# Patient Record
Sex: Female | Born: 1995 | Race: Black or African American | Hispanic: No | Marital: Single | Smoking: Former smoker
Health system: Southern US, Community
[De-identification: ages and names within clinical notes are randomized; demographics above are authoritative.]

## PROBLEM LIST (undated history)

## (undated) ENCOUNTER — Inpatient Hospital Stay (HOSPITAL_COMMUNITY): Payer: Self-pay

## (undated) DIAGNOSIS — J020 Streptococcal pharyngitis: Secondary | ICD-10-CM

## (undated) DIAGNOSIS — A150 Tuberculosis of lung: Secondary | ICD-10-CM

## (undated) HISTORY — PX: KNEE SURGERY: SHX244

---
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
## 2013-03-16 ENCOUNTER — Ambulatory Visit
Admission: RE | Admit: 2013-03-16 | Discharge: 2013-03-16 | Disposition: A | Discharge status: Home / Self Care | Payer: No Typology Code available for payment source | Source: Ambulatory Visit | Attending: Infectious Disease | Admitting: Infectious Disease

## 2013-03-16 ENCOUNTER — Other Ambulatory Visit: Payer: Self-pay | Admitting: Infectious Disease

## 2013-03-16 DIAGNOSIS — R7611 Nonspecific reaction to tuberculin skin test without active tuberculosis: Secondary | ICD-10-CM

## 2013-05-13 ENCOUNTER — Other Ambulatory Visit: Payer: Self-pay

## 2013-07-05 ENCOUNTER — Encounter (HOSPITAL_COMMUNITY): Payer: Self-pay | Admitting: Emergency Medicine

## 2013-07-05 ENCOUNTER — Emergency Department (HOSPITAL_COMMUNITY)
Admission: EM | Admit: 2013-07-05 | Discharge: 2013-07-05 | Disposition: A | Discharge status: Home / Self Care | Payer: Self-pay | Attending: Emergency Medicine | Admitting: Emergency Medicine

## 2013-07-05 ENCOUNTER — Emergency Department (HOSPITAL_COMMUNITY): Payer: Self-pay

## 2013-07-05 DIAGNOSIS — Z3202 Encounter for pregnancy test, result negative: Secondary | ICD-10-CM | POA: Insufficient documentation

## 2013-07-05 DIAGNOSIS — R109 Unspecified abdominal pain: Secondary | ICD-10-CM

## 2013-07-05 DIAGNOSIS — R0602 Shortness of breath: Secondary | ICD-10-CM | POA: Insufficient documentation

## 2013-07-05 DIAGNOSIS — Z79899 Other long term (current) drug therapy: Secondary | ICD-10-CM | POA: Insufficient documentation

## 2013-07-05 DIAGNOSIS — Z87891 Personal history of nicotine dependence: Secondary | ICD-10-CM | POA: Insufficient documentation

## 2013-07-05 DIAGNOSIS — R1012 Left upper quadrant pain: Secondary | ICD-10-CM | POA: Insufficient documentation

## 2013-07-05 DIAGNOSIS — Z8619 Personal history of other infectious and parasitic diseases: Secondary | ICD-10-CM | POA: Insufficient documentation

## 2013-07-05 HISTORY — DX: Tuberculosis of lung: A15.0

## 2013-07-05 LAB — CBC WITH DIFFERENTIAL/PLATELET
Basophils Relative: 0 % (ref 0–1)
Eosinophils Absolute: 0.2 10*3/uL (ref 0.0–0.7)
Eosinophils Relative: 3 % (ref 0–5)
HCT: 35.9 % — ABNORMAL LOW (ref 36.0–46.0)
Hemoglobin: 11.4 g/dL — ABNORMAL LOW (ref 12.0–15.0)
Lymphs Abs: 1.8 10*3/uL (ref 0.7–4.0)
MCH: 24.6 pg — ABNORMAL LOW (ref 26.0–34.0)
MCHC: 31.8 g/dL (ref 30.0–36.0)
MCV: 77.5 fL — ABNORMAL LOW (ref 78.0–100.0)
Monocytes Absolute: 0.5 10*3/uL (ref 0.1–1.0)
Monocytes Relative: 10 % (ref 3–12)
Neutro Abs: 2.2 10*3/uL (ref 1.7–7.7)
Neutrophils Relative %: 48 % (ref 43–77)
Platelets: 212 10*3/uL (ref 150–400)
RBC: 4.63 MIL/uL (ref 3.87–5.11)
RDW: 14.4 % (ref 11.5–15.5)

## 2013-07-05 LAB — URINALYSIS, ROUTINE W REFLEX MICROSCOPIC
Bilirubin Urine: NEGATIVE
Glucose, UA: NEGATIVE mg/dL
Specific Gravity, Urine: 1.031 — ABNORMAL HIGH (ref 1.005–1.030)
pH: 5.5 (ref 5.0–8.0)

## 2013-07-05 LAB — COMPREHENSIVE METABOLIC PANEL
ALT: 9 U/L (ref 0–35)
Albumin: 3.8 g/dL (ref 3.5–5.2)
Alkaline Phosphatase: 59 U/L (ref 39–117)
BUN: 13 mg/dL (ref 6–23)
Calcium: 8.9 mg/dL (ref 8.4–10.5)
Chloride: 101 mEq/L (ref 96–112)
GFR calc non Af Amer: >90 mL/min (ref >90)
Potassium: 3.6 mEq/L (ref 3.5–5.1)
Sodium: 137 mEq/L (ref 135–145)
Total Bilirubin: 0.3 mg/dL (ref 0.3–1.2)
Total Protein: 7.7 g/dL (ref 6.0–8.3)

## 2013-07-05 LAB — URINE MICROSCOPIC-ADD ON

## 2013-07-05 LAB — LIPASE, BLOOD: Lipase: 50 U/L (ref 11–59)

## 2013-07-05 MED ORDER — HYDROMORPHONE HCL PF 1 MG/ML IJ SOLN: 0.5000 mg | Freq: Once | INTRAMUSCULAR | Status: DC

## 2013-07-05 NOTE — ED Provider Notes (Signed)
CSN: [id number]     Arrival date & time 07/05/13  1445 History   First MD Initiated Contact with Patient 07/05/13 1711     Chief Complaint  Patient presents with  . Abdominal Pain   (Consider location/radiation/quality/duration/timing/severity/associated sxs/prior Treatment) HPI Comments: Sarah Norris is a 17 y.o. year-old female with a past medical history of Tb, presenting the Emergency Department with a chief complaint of LUQ pain.  She reports ongoing pain since January 2014, worsened in the past month. 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  She reports her sister traveled to [place] 2 years ago for her wedding and returned 1 month later. She denies diarrhea or constipation, denies bloody stools. She denies hematuria or dysuria. Denies aggrevating factors.  Reports OTC pain medication without relief. Patient's last menstrual period was 06/05/2013. No recent travel, family history or personal history of DVT/PE, lower extremity swelling, smoking, cancer, or exogenous estrogen. Denies travel outside of the country for two years.    The history is provided by the patient and medical records. No language interpreter was used.    Past Medical History  Diagnosis Date  . TB (pulmonary tuberculosis)    Past Surgical History  Procedure Laterality Date  . Knee surgery     Family History  Problem Relation Age of Onset  . Hypertension Mother   . Diabetes Mother    History  Substance Use Topics  . Smoking status: Former Games developer  . Smokeless tobacco: Not on file  . Alcohol Use: Yes     Comment: occ   OB History   Grav Para Term Preterm Abortions TAB SAB Ect Mult Living                 Review of Systems  Constitutional: Negative for fever and chills.  Respiratory: Positive for shortness of breath. Negative for stridor.   Cardiovascular: Negative for palpitations and leg swelling.  Gastrointestinal:  Positive for abdominal pain. Negative for nausea, vomiting, diarrhea, constipation, blood in stool and anal bleeding.    Allergies  Review of patient's allergies indicates no known allergies.  Home Medications   Current Outpatient Rx  Name  Route  Sig  Dispense  Refill  . rifampin (RIFADIN) 300 MG capsule   Oral   Take 600 mg by mouth daily.          BP 110/60  Pulse 66  Temp(Src) 98 F (36.7 C) (Oral)  Resp 16  SpO2 100%  LMP 06/05/2013 Physical Exam  Nursing note and vitals reviewed. Constitutional: She is oriented to person, place, and time. She appears well-developed and well-nourished.  HENT:  Head: Normocephalic and atraumatic.  Cardiovascular: Normal rate.  An irregular rhythm present.  No murmur heard. Pulmonary/Chest: Effort normal and breath sounds normal. She has no wheezes. She has no rales.  Abdominal: Soft. There is tenderness in the left upper quadrant. There is no rigidity, no CVA tenderness, no tenderness at McBurney's point and negative Murphy's sign.  Mild tenderness to palpation  Neurological: She is alert and oriented to person, place, and time.    ED Course  Procedures (including critical care time) Labs Review Labs Reviewed  CBC WITH DIFFERENTIAL - Abnormal; Notable for the following:    Hemoglobin 11.4 (*)    HCT 35.9 (*)    MCV 77.5 (*)    MCH 24.6 (*)    All other components within normal limits  URINALYSIS, ROUTINE W REFLEX MICROSCOPIC -  Abnormal; Notable for the following:    APPearance CLOUDY (*)    Specific Gravity, Urine 1.031 (*)    Hgb urine dipstick SMALL (*)    Ketones, ur 15 (*)    Leukocytes, UA LARGE (*)    All other components within normal limits  URINE MICROSCOPIC-ADD ON - Abnormal; Notable for the following:    Squamous Epithelial / LPF MANY (*)    Bacteria, UA FEW (*)    All other components within normal limits  URINE CULTURE  COMPREHENSIVE METABOLIC PANEL  LIPASE, BLOOD  PREGNANCY, URINE   Imaging Review Dg  Chest 2 View  07/05/2013   CLINICAL DATA:  Abdominal pain, shortness of breath  EXAM: CHEST  2 VIEW  COMPARISON:  03/16/2013  FINDINGS: Grossly unchanged cardiac silhouette and mediastinal contours. No focal parenchymal opacities. No pleural effusion or pneumothorax. No definite evidence of edema. Unchanged bones.  IMPRESSION: No acute cardiopulmonary disease. Specifically, no evidence of pneumonia.   Electronically Signed   By: Simonne Come M.D.   On: 07/05/2013 19:14    EKG Interpretation   None       MDM   1. 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  Minimal discomfort on exam. Labs sent.  Hbg 11.4, stable, no electrolyte abnormalities, UA without infection, XR- without acute findings.  Discussed patient history, condition, and labs with Dr. Ethelda Chick. After his evaluation of the patient he agrees the patient can be evaluated as an out-pt. Discussed lab results, imaging results, and treatment plan with the patient. Return precautions given. Reports understanding and no other concerns at this time.  Patient is stable for discharge at this time.        Clabe Seal, PA-C 07/06/13 1455

## 2013-07-05 NOTE — ED Provider Notes (Signed)
Complains of left upper quadrant pain for one year. Pain is presently mild described as 1 on scale of 1-10. She denies shortness of breath no treatment prior to coming here. No anorexia. No shortness of breath. Patient had positive skin test for tuberculosis. Chest x-ray negative. On exam no distress lungs clear auscultation abdomen nondistended nontender normal active bowel sounds no splenomegaly  Doug Sou, MD 07/05/13 1918

## 2013-07-05 NOTE — ED Notes (Signed)
Pt states she has taken OTC meds for this with no relief; pt has not sought medical attention until now.  The timing of the pain varies from when she has eaten to when she hasn't eaten to when she is on her cycle to when she is not on her cycle.

## 2013-07-05 NOTE — ED Notes (Addendum)
Pt states she's had L side abdominal pain x 1 year.  Pt states pain has gotten worse over the last month.  Pt also states that sometimes at night she feels like she can't breath.  Pt alert and oriented and in NAD at this time.  Respiration equal and unlabored.

## 2013-07-06 ENCOUNTER — Emergency Department (HOSPITAL_COMMUNITY)
Admission: EM | Admit: 2013-07-06 | Discharge: 2013-07-06 | Discharge status: Against Medical Advice | Payer: Medicaid Other | Attending: Emergency Medicine | Admitting: Emergency Medicine

## 2013-07-06 ENCOUNTER — Encounter (HOSPITAL_COMMUNITY): Payer: Self-pay | Admitting: Emergency Medicine

## 2013-07-06 DIAGNOSIS — R079 Chest pain, unspecified: Secondary | ICD-10-CM | POA: Insufficient documentation

## 2013-07-06 DIAGNOSIS — Z79899 Other long term (current) drug therapy: Secondary | ICD-10-CM | POA: Insufficient documentation

## 2013-07-06 DIAGNOSIS — Z8611 Personal history of tuberculosis: Secondary | ICD-10-CM | POA: Insufficient documentation

## 2013-07-06 DIAGNOSIS — R0602 Shortness of breath: Secondary | ICD-10-CM | POA: Insufficient documentation

## 2013-07-06 DIAGNOSIS — Z87891 Personal history of nicotine dependence: Secondary | ICD-10-CM | POA: Insufficient documentation

## 2013-07-06 LAB — URINE CULTURE

## 2013-07-06 NOTE — ED Notes (Signed)
Pt. reports mid chest pain onset this morning with slight SOB , denies nausea or diaphoresis .

## 2013-07-07 NOTE — ED Provider Notes (Signed)
Medical screening examination/treatment/procedure(s) were conducted as a shared visit with non-physician practitioner(s) and myself.  I personally evaluated the patient during the encounter.  EKG Interpretation   None        Sailor Haughn, MD 07/07/13 1056 

## 2013-07-08 DIAGNOSIS — A150 Tuberculosis of lung: Secondary | ICD-10-CM

## 2013-07-08 HISTORY — DX: Tuberculosis of lung: A15.0

## 2014-01-17 ENCOUNTER — Emergency Department (HOSPITAL_COMMUNITY): Payer: Medicaid Other

## 2014-01-17 ENCOUNTER — Encounter (HOSPITAL_COMMUNITY): Payer: Self-pay | Admitting: Emergency Medicine

## 2014-01-17 ENCOUNTER — Emergency Department (HOSPITAL_COMMUNITY)
Admission: EM | Admit: 2014-01-17 | Discharge: 2014-01-17 | Disposition: A | Discharge status: Home / Self Care | Payer: Medicaid Other | Attending: Emergency Medicine | Admitting: Emergency Medicine

## 2014-01-17 DIAGNOSIS — Z79899 Other long term (current) drug therapy: Secondary | ICD-10-CM | POA: Insufficient documentation

## 2014-01-17 DIAGNOSIS — Z87891 Personal history of nicotine dependence: Secondary | ICD-10-CM | POA: Insufficient documentation

## 2014-01-17 DIAGNOSIS — J020 Streptococcal pharyngitis: Secondary | ICD-10-CM | POA: Insufficient documentation

## 2014-01-17 DIAGNOSIS — Z8611 Personal history of tuberculosis: Secondary | ICD-10-CM | POA: Insufficient documentation

## 2014-01-17 HISTORY — DX: Streptococcal pharyngitis: J02.0

## 2014-01-17 LAB — RAPID STREP SCREEN (MED CTR MEBANE ONLY): Streptococcus, Group A Screen (Direct): POSITIVE — AB

## 2014-01-17 MED ORDER — PENICILLIN G BENZATHINE 1200000 UNIT/2ML IM SUSP
1.2000 10*6.[IU] | Freq: Once | INTRAMUSCULAR | Status: AC
  Administered 2014-01-17: 1.2 10*6.[IU] via INTRAMUSCULAR
  Filled 2014-01-17: qty 2

## 2014-01-17 MED ORDER — ACETAMINOPHEN 325 MG PO TABS
650.0000 mg | ORAL_TABLET | Freq: Once | ORAL | Status: AC
  Administered 2014-01-17: 650 mg via ORAL
  Filled 2014-01-17: qty 2

## 2014-01-17 NOTE — Discharge Instructions (Signed)
You have been treated for strep throat here. Refer to attached documents for more information. Follow up with your doctor as needed.

## 2014-01-17 NOTE — ED Notes (Signed)
PT reports throat pain and has a HXof strep throat. Pt also has RT ear pain.

## 2014-01-17 NOTE — ED Notes (Signed)
PT reports she completed treatment of TB 4 months ago. Today Pt has a fever of 100.5 with a sore throat.

## 2014-01-17 NOTE — ED Provider Notes (Signed)
CSN: [id number]     Arrival date & time 01/17/14  4098 History  This chart was scribed for non-physician practitioner Emilia Beck, PA-C working with Rolland Porter, MD by Leone Payor, ED Scribe. This patient was seen in room TR08C/TR08C and the patient's care was started at 10:48 AM.    Chief Complaint  Patient presents with  . Sore Throat  . Otalgia    The history is provided by the patient. No language interpreter was used.    HPI Comments: Sarah Norris is a 18 y.o. female who presents to the Emergency Department complaining of 2 days of gradual onset, gradually worsening, constant sore throat with associated fever of 100.5 in the ED. Patient states the pain is worse with swallowing. She has not taken any OTC medications for her symptoms. She is unsure of any sick contacts with similar symptoms. She denies cough.   Past Medical History  Diagnosis Date  . TB (pulmonary tuberculosis)     4 months ago 2015  . Strep throat    Past Surgical History  Procedure Laterality Date  . Knee surgery     Family History  Problem Relation Age of Onset  . Hypertension Mother   . Diabetes Mother    History  Substance Use Topics  . Smoking status: Former Games developer  . Smokeless tobacco: Not on file  . Alcohol Use: Yes     Comment: occ   OB History   Grav Para Term Preterm Abortions TAB SAB Ect Mult Living                 Review of Systems  Constitutional: Positive for fever.  HENT: Positive for sore throat. Negative for trouble swallowing.   Respiratory: Negative for cough.       Allergies  Review of patient's allergies indicates no known allergies.  Home Medications   Prior to Admission medications   Medication Sig Start Date End Date Taking? Authorizing Provider  rifampin (RIFADIN) 300 MG capsule Take 600 mg by mouth daily.    Historical Provider, MD   BP 126/66  Pulse 83  Temp(Src) 100.5 F (38.1 C)  Resp 20  Ht 5\' 3"  (1.6 m)  Wt 161 lb (73.029 kg)  BMI 28.53 kg/m2   SpO2 98%  LMP 01/08/2014 Physical Exam  Nursing note and vitals reviewed. Constitutional: She is oriented to person, place, and time. She appears well-developed and well-nourished.  HENT:  Head: Normocephalic and atraumatic.  Mouth/Throat: Posterior oropharyngeal erythema present. No oropharyngeal exudate or posterior oropharyngeal edema.  Posterior pharynx erythema with mild tonsillar edema. No exudate.   Neck: Normal range of motion. Neck supple.  Cardiovascular: Normal rate, regular rhythm and normal heart sounds.   Pulmonary/Chest: Effort normal and breath sounds normal. No respiratory distress. She has no wheezes. She has no rales.  Abdominal: She exhibits no distension.  Lymphadenopathy:    She has cervical adenopathy.  Neurological: She is alert and oriented to person, place, and time.  Skin: Skin is warm and dry.  Psychiatric: She has a normal mood and affect.    ED Course  Procedures (including critical care time)  DIAGNOSTIC STUDIES: Oxygen Saturation is 98% on RA, normal by my interpretation.    COORDINATION OF CARE: 10:51 AM Discussed treatment plan with pt at bedside and pt agreed to plan.   Labs Review Labs Reviewed  RAPID STREP SCREEN - Abnormal; Notable for the following:    Streptococcus, Group A Screen (Direct) POSITIVE (*)    All  other components within normal limits    Imaging Review Dg Chest 2 View  01/17/2014   CLINICAL DATA:  Fever.  Sore throat.  EXAM: CHEST  2 VIEW  COMPARISON:  PA and lateral chest 07/05/2013.  FINDINGS: Heart size and mediastinal contours are within normal limits. Both lungs are clear. Visualized skeletal structures are unremarkable.  IMPRESSION: Negative exam.   Electronically Signed   By: Drusilla Kannerhomas  Dalessio M.D.   On: 01/17/2014 10:40     EKG Interpretation None      MDM   Final diagnoses:  Strep pharyngitis   10:55 AM Patient will have IM bicillin for strep throat. Chest xray unremarkable. Patient's given tylenol for  fever. Patient's remaining vitals stable. Patient advised to follow up with PCP as needed. No further evaluation needed at this time.   I personally performed the services described in this documentation, which was scribed in my presence. The recorded information has been reviewed and is accurate.   Emilia BeckKaitlyn Mariane Burpee, PA-C 01/17/14 1056

## 2014-01-17 NOTE — ED Notes (Signed)
Declined W/C at D/C and was escorted to lobby by RN. 

## 2014-01-25 NOTE — ED Provider Notes (Signed)
Medical screening examination/treatment/procedure(s) were performed by non-physician practitioner and as supervising physician I was immediately available for consultation/collaboration.   EKG Interpretation None        Rolland PorterMark Tatanisha Cuthbert, MD 01/25/14 [phone]

## 2014-03-24 ENCOUNTER — Emergency Department (HOSPITAL_COMMUNITY): Payer: BC Managed Care – PPO

## 2014-03-24 ENCOUNTER — Emergency Department (HOSPITAL_COMMUNITY)
Admission: EM | Admit: 2014-03-24 | Discharge: 2014-03-25 | Disposition: A | Discharge status: Home / Self Care | Payer: BC Managed Care – PPO | Attending: Emergency Medicine | Admitting: Emergency Medicine

## 2014-03-24 ENCOUNTER — Encounter (HOSPITAL_COMMUNITY): Payer: Self-pay | Admitting: Emergency Medicine

## 2014-03-24 DIAGNOSIS — F3289 Other specified depressive episodes: Secondary | ICD-10-CM | POA: Diagnosis not present

## 2014-03-24 DIAGNOSIS — F329 Major depressive disorder, single episode, unspecified: Secondary | ICD-10-CM | POA: Insufficient documentation

## 2014-03-24 DIAGNOSIS — Z792 Long term (current) use of antibiotics: Secondary | ICD-10-CM | POA: Diagnosis not present

## 2014-03-24 DIAGNOSIS — Z8701 Personal history of pneumonia (recurrent): Secondary | ICD-10-CM | POA: Insufficient documentation

## 2014-03-24 DIAGNOSIS — R0602 Shortness of breath: Secondary | ICD-10-CM | POA: Insufficient documentation

## 2014-03-24 DIAGNOSIS — Z87891 Personal history of nicotine dependence: Secondary | ICD-10-CM | POA: Insufficient documentation

## 2014-03-24 DIAGNOSIS — Z8619 Personal history of other infectious and parasitic diseases: Secondary | ICD-10-CM | POA: Insufficient documentation

## 2014-03-24 DIAGNOSIS — F1210 Cannabis abuse, uncomplicated: Secondary | ICD-10-CM | POA: Diagnosis not present

## 2014-03-24 DIAGNOSIS — F32A Depression, unspecified: Secondary | ICD-10-CM

## 2014-03-24 DIAGNOSIS — Z3202 Encounter for pregnancy test, result negative: Secondary | ICD-10-CM | POA: Diagnosis not present

## 2014-03-24 DIAGNOSIS — F1290 Cannabis use, unspecified, uncomplicated: Secondary | ICD-10-CM

## 2014-03-24 LAB — CBC
HCT: 33.1 % — ABNORMAL LOW (ref 36.0–46.0)
Hemoglobin: 10.5 g/dL — ABNORMAL LOW (ref 12.0–15.0)
MCH: 24 pg — AB (ref 26.0–34.0)
MCHC: 31.7 g/dL (ref 30.0–36.0)
MCV: 75.6 fL — AB (ref 78.0–100.0)
PLATELETS: 242 10*3/uL (ref 150–400)
RBC: 4.38 MIL/uL (ref 3.87–5.11)
RDW: 14.1 % (ref 11.5–15.5)
WBC: 4.8 10*3/uL (ref 4.0–10.5)

## 2014-03-24 LAB — COMPREHENSIVE METABOLIC PANEL
ALT: 14 U/L (ref 0–35)
AST: 28 U/L (ref 0–37)
Albumin: 3.7 g/dL (ref 3.5–5.2)
Alkaline Phosphatase: 62 U/L (ref 39–117)
Anion gap: 12 (ref 5–15)
BUN: 17 mg/dL (ref 6–23)
CALCIUM: 9 mg/dL (ref 8.4–10.5)
CO2: 27 meq/L (ref 19–32)
Chloride: 101 mEq/L (ref 96–112)
Creatinine, Ser: 0.6 mg/dL (ref 0.50–1.10)
GFR calc Af Amer: >90 mL/min (ref >90)
GFR calc non Af Amer: >90 mL/min (ref >90)
Glucose, Bld: 94 mg/dL (ref 70–99)
Potassium: 4.1 mEq/L (ref 3.7–5.3)
Sodium: 140 mEq/L (ref 137–147)
Total Bilirubin: <0.2 mg/dL — ABNORMAL LOW (ref 0.3–1.2)
Total Protein: 7.7 g/dL (ref 6.0–8.3)

## 2014-03-24 LAB — RAPID URINE DRUG SCREEN, HOSP PERFORMED
Amphetamines: NOT DETECTED
BENZODIAZEPINES: NOT DETECTED
Barbiturates: NOT DETECTED
Cocaine: NOT DETECTED
Opiates: NOT DETECTED
Tetrahydrocannabinol: POSITIVE — AB

## 2014-03-24 LAB — URINALYSIS, ROUTINE W REFLEX MICROSCOPIC
BILIRUBIN URINE: NEGATIVE
Glucose, UA: NEGATIVE mg/dL
Hgb urine dipstick: NEGATIVE
Ketones, ur: NEGATIVE mg/dL
LEUKOCYTES UA: NEGATIVE
NITRITE: NEGATIVE
PH: 6 (ref 5.0–8.0)
Protein, ur: NEGATIVE mg/dL
Specific Gravity, Urine: 1.033 — ABNORMAL HIGH (ref 1.005–1.030)
Urobilinogen, UA: 0.2 mg/dL (ref 0.0–1.0)

## 2014-03-24 LAB — SALICYLATE LEVEL

## 2014-03-24 LAB — ETHANOL

## 2014-03-24 LAB — PREGNANCY, URINE: Preg Test, Ur: NEGATIVE

## 2014-03-24 LAB — ACETAMINOPHEN LEVEL: Acetaminophen (Tylenol), Serum: <15 ug/mL (ref 10–30)

## 2014-03-24 MED ORDER — IBUPROFEN 400 MG PO TABS: 600.0000 mg | ORAL_TABLET | Freq: Three times a day (TID) | ORAL | Status: DC | PRN

## 2014-03-24 MED ORDER — ACETAMINOPHEN 325 MG PO TABS: 650.0000 mg | ORAL_TABLET | ORAL | Status: DC | PRN

## 2014-03-24 NOTE — ED Notes (Signed)
Pt reports that she is seeing her school counselor and they suggested she come in because she has been depressed with a history of SI. Denies SI at present. Pt calm and cooperative, no visitors present.

## 2014-03-24 NOTE — ED Notes (Signed)
Pt reports she is here today because her school counselor thinks she needs to have a psych eval because she is depressed and having SI. Pt denies SI currently but sts she has had them in the past. Denies HI. sts that she has been depressed lately, not taking any medications for it right now. Nad, skin warm and dry, resp e/u.
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
## 2014-03-24 NOTE — ED Notes (Signed)
The patient has a nose ring, a belly button ring and earrings (a figure eight, gold). A cell phone a gold and white case. The tech has reported to RN in charge.

## 2014-03-25 MED ORDER — SERTRALINE HCL 50 MG PO TABS: 50.0000 mg | ORAL_TABLET | Freq: Every day | ORAL | Status: DC

## 2014-03-25 NOTE — ED Provider Notes (Signed)
Medical screening examination/treatment/procedure(s) were performed by non-physician practitioner and as supervising physician I was immediately available for consultation/collaboration.   EKG Interpretation None        Glynn Octave, MD 03/25/14 [phone]

## 2014-03-25 NOTE — ED Notes (Signed)
Telephsych machine at bedside, Marchelle Folks to assess at 1351

## 2014-03-25 NOTE — ED Notes (Signed)
This RN spoke with Marchelle Folks about d/c. EDP notified

## 2014-03-25 NOTE — ED Notes (Signed)
Marylu Lund, Pt mentor called to ask for update on pt status, Pt gave this RN verbal consent to speak with Marylu Lund. Pt mentor updated on TTS wait time. Pt also informed.
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
## 2014-03-25 NOTE — ED Provider Notes (Signed)
According to our behavioral health colleagues, the patient does not meet criteria for inpatient admission. Patient is amenable to discharge, with outpatient followup, which has been scheduled for her. Patient will be started on a new medication.    Gerhard Munch, MD 03/25/14 [phone]

## 2014-03-25 NOTE — ED Notes (Signed)
Spoke with Marchelle Folks, states pt is non SI/HI and would like to speak with EDP for possible anti depressant scripts and then pt is okay to be d/c home. EDP direct line given to [facility].

## 2014-03-25 NOTE — Discharge Instructions (Signed)
Please monitor your condition carefully, and do not hesitate to return for concerning changes in your condition.  Otherwise, please take all medication as directed, and be sure to followup with our behavioral health team.

## 2014-04-22 ENCOUNTER — Other Ambulatory Visit: Payer: Self-pay

## 2014-04-26 ENCOUNTER — Ambulatory Visit (HOSPITAL_COMMUNITY): Payer: Self-pay | Admitting: Psychiatry

## 2014-05-19 ENCOUNTER — Ambulatory Visit (HOSPITAL_COMMUNITY): Payer: BC Managed Care – PPO | Admitting: Psychiatry

## 2014-07-11 ENCOUNTER — Encounter (HOSPITAL_COMMUNITY): Payer: Self-pay | Admitting: Emergency Medicine

## 2014-07-11 ENCOUNTER — Emergency Department (HOSPITAL_COMMUNITY)
Admission: EM | Admit: 2014-07-11 | Discharge: 2014-07-11 | Disposition: A | Discharge status: Home / Self Care | Payer: No Typology Code available for payment source | Attending: Emergency Medicine | Admitting: Emergency Medicine

## 2014-07-11 DIAGNOSIS — Z87891 Personal history of nicotine dependence: Secondary | ICD-10-CM | POA: Insufficient documentation

## 2014-07-11 DIAGNOSIS — Z8611 Personal history of tuberculosis: Secondary | ICD-10-CM | POA: Insufficient documentation

## 2014-07-11 DIAGNOSIS — Z3202 Encounter for pregnancy test, result negative: Secondary | ICD-10-CM | POA: Insufficient documentation

## 2014-07-11 DIAGNOSIS — N390 Urinary tract infection, site not specified: Secondary | ICD-10-CM | POA: Insufficient documentation

## 2014-07-11 DIAGNOSIS — Z79899 Other long term (current) drug therapy: Secondary | ICD-10-CM | POA: Insufficient documentation

## 2014-07-11 DIAGNOSIS — Z8709 Personal history of other diseases of the respiratory system: Secondary | ICD-10-CM | POA: Insufficient documentation

## 2014-07-11 LAB — CBC WITH DIFFERENTIAL/PLATELET
Basophils Absolute: 0 10*3/uL (ref 0.0–0.1)
Basophils Relative: 0 % (ref 0–1)
EOS PCT: 1 % (ref 0–5)
Eosinophils Absolute: 0.1 10*3/uL (ref 0.0–0.7)
HCT: 35.9 % — ABNORMAL LOW (ref 36.0–46.0)
Hemoglobin: 11.5 g/dL — ABNORMAL LOW (ref 12.0–15.0)
LYMPHS PCT: 24 % (ref 12–46)
Lymphs Abs: 1.6 10*3/uL (ref 0.7–4.0)
MCH: 24.8 pg — ABNORMAL LOW (ref 26.0–34.0)
MCHC: 32 g/dL (ref 30.0–36.0)
MCV: 77.5 fL — AB (ref 78.0–100.0)
MONO ABS: 0.5 10*3/uL (ref 0.1–1.0)
Monocytes Relative: 7 % (ref 3–12)
Neutro Abs: 4.5 10*3/uL (ref 1.7–7.7)
Neutrophils Relative %: 68 % (ref 43–77)
PLATELETS: 248 10*3/uL (ref 150–400)
RBC: 4.63 MIL/uL (ref 3.87–5.11)
RDW: 14.3 % (ref 11.5–15.5)
WBC: 6.7 10*3/uL (ref 4.0–10.5)

## 2014-07-11 LAB — COMPREHENSIVE METABOLIC PANEL
ALBUMIN: 3.8 g/dL (ref 3.5–5.2)
ALT: 10 U/L (ref 0–35)
AST: 17 U/L (ref 0–37)
Alkaline Phosphatase: 66 U/L (ref 39–117)
Anion gap: 5 (ref 5–15)
BILIRUBIN TOTAL: 0.3 mg/dL (ref 0.3–1.2)
BUN: 16 mg/dL (ref 6–23)
CALCIUM: 9.1 mg/dL (ref 8.4–10.5)
CHLORIDE: 102 meq/L (ref 96–112)
CO2: 29 mmol/L (ref 19–32)
CREATININE: 0.63 mg/dL (ref 0.50–1.10)
GFR calc Af Amer: >90 mL/min (ref >90)
GFR calc non Af Amer: >90 mL/min (ref >90)
Glucose, Bld: 89 mg/dL (ref 70–99)
Potassium: 3.8 mmol/L (ref 3.5–5.1)
SODIUM: 136 mmol/L (ref 135–145)
Total Protein: 7.5 g/dL (ref 6.0–8.3)

## 2014-07-11 LAB — URINALYSIS, ROUTINE W REFLEX MICROSCOPIC
Bilirubin Urine: NEGATIVE
Glucose, UA: NEGATIVE mg/dL
Ketones, ur: NEGATIVE mg/dL
NITRITE: POSITIVE — AB
PH: 6 (ref 5.0–8.0)
Protein, ur: 30 mg/dL — AB
Specific Gravity, Urine: 1.022 (ref 1.005–1.030)
Urobilinogen, UA: 0.2 mg/dL (ref 0.0–1.0)

## 2014-07-11 LAB — URINE MICROSCOPIC-ADD ON

## 2014-07-11 LAB — PREGNANCY, URINE: Preg Test, Ur: NEGATIVE

## 2014-07-11 MED ORDER — DIPHENHYDRAMINE HCL 25 MG PO CAPS
50.0000 mg | ORAL_CAPSULE | Freq: Once | ORAL | Status: AC
  Administered 2014-07-11: 50 mg via ORAL
  Filled 2014-07-11: qty 2

## 2014-07-11 MED ORDER — PHENAZOPYRIDINE HCL 200 MG PO TABS: 200.0000 mg | ORAL_TABLET | Freq: Three times a day (TID) | ORAL | Status: DC

## 2014-07-11 MED ORDER — CEPHALEXIN 250 MG PO CAPS
500.0000 mg | ORAL_CAPSULE | Freq: Once | ORAL | Status: AC
  Administered 2014-07-11: 500 mg via ORAL
  Filled 2014-07-11: qty 2

## 2014-07-11 MED ORDER — HYDROCORTISONE 0.5 % EX CREA
TOPICAL_CREAM | Freq: Once | CUTANEOUS | Status: AC
  Administered 2014-07-11: 23:00:00 via TOPICAL
  Filled 2014-07-11: qty 28.35

## 2014-07-11 MED ORDER — CEPHALEXIN 500 MG PO CAPS: 500.0000 mg | ORAL_CAPSULE | Freq: Two times a day (BID) | ORAL | Status: DC

## 2014-07-11 MED ORDER — PHENAZOPYRIDINE HCL 100 MG PO TABS
200.0000 mg | ORAL_TABLET | Freq: Once | ORAL | Status: AC
  Administered 2014-07-11: 200 mg via ORAL
  Filled 2014-07-11: qty 2

## 2014-07-11 MED ORDER — FAMOTIDINE 20 MG PO TABS
20.0000 mg | ORAL_TABLET | Freq: Once | ORAL | Status: AC
  Administered 2014-07-11: 20 mg via ORAL
  Filled 2014-07-11: qty 1

## 2014-07-11 NOTE — ED Notes (Signed)
Pt. reports hematuria /dysuria and low abdominal cramping onset this afternoon , denies fever / no injury.

## 2014-07-11 NOTE — ED Provider Notes (Signed)
CSN: [id number]     Arrival date & time 07/11/14  1944 History   First MD Initiated Contact with Patient 07/11/14 2143     Chief Complaint  Patient presents with  . Hematuria  . Dysuria     (Consider location/radiation/quality/duration/timing/severity/associated sxs/prior Treatment) HPI   Sarah Norris is a 19 y.o. female complaining of hematuria which she noticed this afternoon followed by dysuria. Patient denies frequency, abnormal vaginal discharge, lower abdominal pain, fever, chills, nausea, vomiting, flank pain.  Past Medical History  Diagnosis Date  . TB (pulmonary tuberculosis)     4 months ago 2015  . Strep throat    Past Surgical History  Procedure Laterality Date  . Knee surgery     Family History  Problem Relation Age of Onset  . Hypertension Mother   . Diabetes Mother    History  Substance Use Topics  . Smoking status: Former Games developer  . Smokeless tobacco: Not on file  . Alcohol Use: Yes     Comment: occ   OB History    No data available     Review of Systems  10 systems reviewed and found to be negative, except as noted in the HPI.   Allergies  Review of patient's allergies indicates no known allergies.  Home Medications   Prior to Admission medications   Medication Sig Start Date End Date Taking? Authorizing Provider  cephALEXin (KEFLEX) 500 MG capsule Take 1 capsule (500 mg total) by mouth 2 (two) times daily. 07/11/14   Lannis Lichtenwalner, PA-C  phenazopyridine (PYRIDIUM) 200 MG tablet Take 1 tablet (200 mg total) by mouth 3 (three) times daily. 07/11/14   Little Bashore, PA-C  sertraline (ZOLOFT) 50 MG tablet Take 1 tablet (50 mg total) by mouth daily. 03/25/14   Gerhard Munch, MD   BP 108/39 mmHg  Pulse 68  Temp(Src) 98.2 F (36.8 C) (Oral)  Resp 16  Ht  (1.6 m)  Wt 159 lb 6 oz (72.292 kg)  BMI 28.24 kg/m2  SpO2 100%  LMP 06/27/2014 Physical Exam  Constitutional: She is oriented to person, place, and time. She appears  well-developed and well-nourished. No distress.  HENT:  Head: Normocephalic.  Eyes: Conjunctivae and EOM are normal.  Cardiovascular: Normal rate.   Pulmonary/Chest: Effort normal and breath sounds normal. No stridor.  Abdominal: Soft. Bowel sounds are normal. She exhibits no distension and no mass. There is no rebound and no guarding.  Mild suprapubic tenderness palpation with no guarding or rebound  Genitourinary:  No CVA tenderness palpation bilaterally  Musculoskeletal: Normal range of motion.  Neurological: She is alert and oriented to person, place, and time.  Psychiatric: She has a normal mood and affect.  Nursing note and vitals reviewed.   ED Course  Procedures (including critical care time) Labs Review Labs Reviewed  URINALYSIS, ROUTINE W REFLEX MICROSCOPIC - Abnormal; Notable for the following:    APPearance CLOUDY (*)    Hgb urine dipstick LARGE (*)    Protein, ur 30 (*)    Nitrite POSITIVE (*)    Leukocytes, UA MODERATE (*)    All other components within normal limits  CBC WITH DIFFERENTIAL - Abnormal; Notable for the following:    Hemoglobin 11.5 (*)    HCT 35.9 (*)    MCV 77.5 (*)    MCH 24.8 (*)    All other components within normal limits  URINE MICROSCOPIC-ADD ON - Abnormal; Notable for the following:    Squamous Epithelial / LPF FEW (*)  Bacteria, UA MANY (*)    All other components within normal limits  URINE CULTURE  PREGNANCY, URINE  COMPREHENSIVE METABOLIC PANEL    Imaging Review No results found.   EKG Interpretation None      MDM   Final diagnoses:  UTI (lower urinary tract infection)    Filed Vitals:   07/11/14 1946 07/11/14 1948 07/11/14 2200  BP: 129/86  108/39  Pulse: 81  68  Temp: 98.2 F (36.8 C)    TempSrc: Oral    Resp: 18  16  Height:   (1.6 m)   Weight:  159 lb 6 oz (72.292 kg)   SpO2: 95%  100%    Medications  cephALEXin (KEFLEX) capsule 500 mg (not administered)  phenazopyridine (PYRIDIUM) tablet 200 mg  (not administered)    [name] is a pleasant 19 y.o. female presenting with hematuria and dysuria onset this afternoon. Urinalysis with nitrite, leukocyte, 20-50 white blood cell and many bacteria. Patient will be started on Keflex and given Pyridium. Urine culture is sent. No signs of pyelonephritis, discussed return precautions for pyelonephritis.  Evaluation does not show pathology that would require ongoing emergent intervention or inpatient treatment. Pt is hemodynamically stable and mentating appropriately. Discussed findings and plan with patient/guardian, who agrees with care plan. All questions answered. Return precautions discussed and outpatient follow up given.   New Prescriptions   CEPHALEXIN (KEFLEX) 500 MG CAPSULE    Take 1 capsule (500 mg total) by mouth 2 (two) times daily.   PHENAZOPYRIDINE (PYRIDIUM) 200 MG TABLET    Take 1 tablet (200 mg total) by mouth 3 (three) times daily.         Wynetta Emery, PA-C 07/11/14 2207  At discharge patient notes a rash on her chest. I have evaluated the rash and it appears erythematous and it is also pruritic. Patient just received the Keflex orally, there is no airway involvement, she denies shortness of breath, there is no lip or tongue swelling, no posterior pharynx edema. Lung sounds are clear to auscultation bilaterally, there is no dyspepsia no nausea. Any new exposures. I think this is an allergic reaction however doubt is an allergic reaction to the Keflex that she just received it. I've advised the patient to return to the ED immediately if she develops any of the red flags for allergic reaction including airway involvement, wheezing, shortness of breath, lip or tongue swelling. Patient will be given Benadryl/Pepcid PO and topical hydrocortisone in the ED.  Patient reassessed before discharge, rash is not spreading, no airway involvement, no dyspepsia, no angioedema or posterior airway swelling. Advised patient to keep  taking the Keflex/pyridium but DC'd immediately if rash spreads and call 911 to return to ED if any secondary system symptoms occur. Patient verbalized her understanding.  Wynetta Emery, PA-C 07/11/14 2355  Gerhard Munch, MD 07/12/14 [phone]

## 2014-07-11 NOTE — ED Notes (Addendum)
Pt developed red rash to her chest. Reports it is mildly itchy. Denies any SOB and no oral swelling. Resp e/u. Will monitor for 40 minutes and then reevaluate.

## 2014-07-11 NOTE — Discharge Instructions (Signed)
°  Take your antibiotics as directed and to completion. You should never have any leftover antibiotics! Push fluids and stay well hydrated.  ° °Any antibiotic use can reduce the efficacy of hormonal birth control. Please use back up method of contraception.  ° °Please follow with your primary care doctor in the next 2 days for a check-up. They must obtain records for further management.  ° °Do not hesitate to return to the Emergency Department for any new, worsening or concerning symptoms.  ° °

## 2014-07-11 NOTE — ED Notes (Signed)
Pt reports she feels better. Denies any SOB and no oral swelling noted. Rash appears lighter and is less itchy. VSS and resp e/u.

## 2014-07-12 ENCOUNTER — Telehealth: Payer: Self-pay | Admitting: *Deleted

## 2014-07-12 NOTE — ED Notes (Signed)
Prescriptions for Pyridium and Keflex prescribed at 07-08-2014 visit [facility] [address], [place]

## 2014-07-14 ENCOUNTER — Telehealth (HOSPITAL_BASED_OUTPATIENT_CLINIC_OR_DEPARTMENT_OTHER): Payer: Self-pay | Admitting: Emergency Medicine

## 2014-07-14 LAB — URINE CULTURE: Colony Count: >=100000

## 2014-07-14 NOTE — Telephone Encounter (Signed)
Post ED Visit - Positive Culture Follow-up  Culture report reviewed by antimicrobial stewardship pharmacist: []  Wes Dulaney, Pharm.D., BCPS []  Celedonio MiyamotoJeremy Frens, Pharm.D., BCPS [x]  Georgina PillionElizabeth Martin, Pharm.D., BCPS []  [place]ne[place] BeachMinh Pham, [address]Ph[address]D., BCPS, AAHIVP []  Estella HuskMichelle Turner, Pharm.D., BCPS, AAHIVP []  Elder CyphersLorie Poole, [address]Ph[address]D., BCPS  Positive urine culture E. Coli Treated with cephalexin, organism sensitive to the same and no further patient follow-up is required at this time.  Berle MullMiller, Jonique Kulig 07/14/2014, 11:33 AM

## 2014-07-15 ENCOUNTER — Telehealth (HOSPITAL_COMMUNITY): Payer: Self-pay

## 2014-07-15 NOTE — Telephone Encounter (Signed)
Post ED Visit - Positive Culture Follow-up  Culture report reviewed by antimicrobial stewardship pharmacist: [] Wes Dulaney, Pharm.D., BCPS [] Jeremy Frens, Pharm.D., BCPS [] Elizabeth Martin, Pharm.D., BCPS [] Minh Pham, Pharm.D., BCPS, AAHIVP [x] Michelle Turner, Pharm.D., BCPS, AAHIVP [] Lorie Poole, Pharm.D., BCPS  Positive Urine culture, >/= 100,000 colonies -> E Coli Treated with Cephalexin, organism sensitive to the same and no further patient follow-up is required at this time.  Sanaa Zilberman Dorn 07/15/2014, 12:07 PM 

## 2014-08-01 ENCOUNTER — Emergency Department (HOSPITAL_COMMUNITY)
Admission: EM | Admit: 2014-08-01 | Discharge: 2014-08-01 | Disposition: A | Discharge status: Home / Self Care | Payer: BLUE CROSS/BLUE SHIELD | Attending: Emergency Medicine | Admitting: Emergency Medicine

## 2014-08-01 ENCOUNTER — Encounter (HOSPITAL_COMMUNITY): Payer: Self-pay | Admitting: *Deleted

## 2014-08-01 DIAGNOSIS — Z87891 Personal history of nicotine dependence: Secondary | ICD-10-CM | POA: Insufficient documentation

## 2014-08-01 DIAGNOSIS — R1110 Vomiting, unspecified: Secondary | ICD-10-CM | POA: Diagnosis not present

## 2014-08-01 DIAGNOSIS — Z792 Long term (current) use of antibiotics: Secondary | ICD-10-CM | POA: Insufficient documentation

## 2014-08-01 DIAGNOSIS — R197 Diarrhea, unspecified: Secondary | ICD-10-CM | POA: Diagnosis not present

## 2014-08-01 DIAGNOSIS — Z8611 Personal history of tuberculosis: Secondary | ICD-10-CM | POA: Insufficient documentation

## 2014-08-01 DIAGNOSIS — Z8709 Personal history of other diseases of the respiratory system: Secondary | ICD-10-CM | POA: Diagnosis not present

## 2014-08-01 DIAGNOSIS — Z79899 Other long term (current) drug therapy: Secondary | ICD-10-CM | POA: Insufficient documentation

## 2014-08-01 DIAGNOSIS — Z3202 Encounter for pregnancy test, result negative: Secondary | ICD-10-CM | POA: Diagnosis not present

## 2014-08-01 LAB — URINALYSIS, ROUTINE W REFLEX MICROSCOPIC
Bilirubin Urine: NEGATIVE
GLUCOSE, UA: NEGATIVE mg/dL
Hgb urine dipstick: NEGATIVE
Ketones, ur: NEGATIVE mg/dL
Nitrite: NEGATIVE
PH: 5.5 (ref 5.0–8.0)
Protein, ur: NEGATIVE mg/dL
SPECIFIC GRAVITY, URINE: 1.019 (ref 1.005–1.030)
Urobilinogen, UA: 0.2 mg/dL (ref 0.0–1.0)

## 2014-08-01 LAB — CBC WITH DIFFERENTIAL/PLATELET
Basophils Absolute: 0 10*3/uL (ref 0.0–0.1)
Basophils Relative: 0 % (ref 0–1)
EOS ABS: 0 10*3/uL (ref 0.0–0.7)
Eosinophils Relative: 1 % (ref 0–5)
HEMATOCRIT: 36.8 % (ref 36.0–46.0)
HEMOGLOBIN: 11.6 g/dL — AB (ref 12.0–15.0)
Lymphocytes Relative: 22 % (ref 12–46)
Lymphs Abs: 0.7 10*3/uL (ref 0.7–4.0)
MCH: 24.8 pg — ABNORMAL LOW (ref 26.0–34.0)
MCHC: 31.5 g/dL (ref 30.0–36.0)
MCV: 78.8 fL (ref 78.0–100.0)
MONO ABS: 0.3 10*3/uL (ref 0.1–1.0)
Monocytes Relative: 10 % (ref 3–12)
NEUTROS PCT: 67 % (ref 43–77)
Neutro Abs: 2 10*3/uL (ref 1.7–7.7)
Platelets: 208 10*3/uL (ref 150–400)
RBC: 4.67 MIL/uL (ref 3.87–5.11)
RDW: 14.4 % (ref 11.5–15.5)
WBC: 3 10*3/uL — ABNORMAL LOW (ref 4.0–10.5)

## 2014-08-01 LAB — COMPREHENSIVE METABOLIC PANEL
ALBUMIN: 3.7 g/dL (ref 3.5–5.2)
ALK PHOS: 55 U/L (ref 39–117)
ALT: 13 U/L (ref 0–35)
AST: 20 U/L (ref 0–37)
Anion gap: 7 (ref 5–15)
BILIRUBIN TOTAL: 0.5 mg/dL (ref 0.3–1.2)
BUN: 11 mg/dL (ref 6–23)
CHLORIDE: 105 mmol/L (ref 96–112)
CO2: 26 mmol/L (ref 19–32)
Calcium: 8.5 mg/dL (ref 8.4–10.5)
Creatinine, Ser: 0.65 mg/dL (ref 0.50–1.10)
GFR calc non Af Amer: >90 mL/min (ref >90)
Glucose, Bld: 86 mg/dL (ref 70–99)
Potassium: 3.5 mmol/L (ref 3.5–5.1)
SODIUM: 138 mmol/L (ref 135–145)
Total Protein: 7.5 g/dL (ref 6.0–8.3)

## 2014-08-01 LAB — URINE MICROSCOPIC-ADD ON

## 2014-08-01 LAB — PREGNANCY, URINE: Preg Test, Ur: NEGATIVE

## 2014-08-01 MED ORDER — ONDANSETRON HCL 4 MG/2ML IJ SOLN
4.0000 mg | Freq: Once | INTRAMUSCULAR | Status: AC
  Administered 2014-08-01: 4 mg via INTRAMUSCULAR
  Filled 2014-08-01: qty 2

## 2014-08-01 MED ORDER — ONDANSETRON 4 MG PO TBDP: 4.0000 mg | ORAL_TABLET | Freq: Three times a day (TID) | ORAL | Status: DC | PRN

## 2014-08-01 MED ORDER — SODIUM CHLORIDE 0.9 % IV SOLN
Freq: Once | INTRAVENOUS | Status: AC
Start: 2014-08-01 — End: 2014-08-01
  Administered 2014-08-01: 11:00:00 via INTRAVENOUS

## 2014-08-01 NOTE — ED Provider Notes (Signed)
CSN: [id number]     Arrival date & time 08/01/14  1001 History   First MD Initiated Contact with Patient 08/01/14 1028     Chief Complaint  Patient presents with  . Emesis  . Diarrhea     (Consider location/radiation/quality/duration/timing/severity/associated sxs/prior Treatment) Patient is a 19 y.o. female presenting with vomiting and diarrhea. The history is provided by the patient. No language interpreter was used.  Emesis Severity:  Moderate Duration:  1 day Timing:  Constant Number of daily episodes:  Multiple Able to tolerate:  Liquids Progression:  Worsening Chronicity:  New Recent urination:  Normal Relieved by:  Nothing Worsened by:  Nothing tried Ineffective treatments:  None tried Associated symptoms: diarrhea   Risk factors: not pregnant now and no prior abdominal surgery   Diarrhea Associated symptoms: vomiting     Past Medical History  Diagnosis Date  . TB (pulmonary tuberculosis)     4 months ago 2015  . Strep throat    Past Surgical History  Procedure Laterality Date  . Knee surgery     Family History  Problem Relation Age of Onset  . Hypertension Mother   . Diabetes Mother    History  Substance Use Topics  . Smoking status: Former Games developermoker  . Smokeless tobacco: Not on file  . Alcohol Use: Yes     Comment: occ   OB History    No data available     Review of Systems  Gastrointestinal: Positive for vomiting and diarrhea.  All other systems reviewed and are negative.     Allergies  Review of patient's allergies indicates no known allergies.  Home Medications   Prior to Admission medications   Medication Sig Start Date End Date Taking? Authorizing Provider  cephALEXin (KEFLEX) 500 MG capsule Take 1 capsule (500 mg total) by mouth 2 (two) times daily. 07/11/14   Nicole Pisciotta, PA-C  phenazopyridine (PYRIDIUM) 200 MG tablet Take 1 tablet (200 mg total) by mouth 3 (three) times daily. 07/11/14   Nicole Pisciotta, PA-C  sertraline (ZOLOFT)  50 MG tablet Take 1 tablet (50 mg total) by mouth daily. 03/25/14   Gerhard Munchobert Lockwood, MD   BP 113/73 mmHg  Pulse 63  Temp(Src) 98.6 F (37 C) (Oral)  Resp 18  SpO2 100%  LMP 07/28/2014 Physical Exam  Constitutional: She is oriented to person, place, and time. She appears well-developed and well-nourished.  HENT:  Head: Normocephalic.  Right Ear: External ear normal.  Left Ear: External ear normal.  Nose: Nose normal.  Mouth/Throat: Oropharynx is clear and moist.  Eyes: EOM are normal. Pupils are equal, round, and reactive to light.  Neck: Normal range of motion.  Cardiovascular: Normal rate and normal heart sounds.   Pulmonary/Chest: Effort normal and breath sounds normal.  Abdominal: Soft. She exhibits no distension.  Musculoskeletal: Normal range of motion.  Neurological: She is alert and oriented to person, place, and time.  Skin: Skin is warm.  Psychiatric: She has a normal mood and affect.  Nursing note and vitals reviewed.   ED Course  Procedures (including critical care time) Labs Review Labs Reviewed  CBC WITH DIFFERENTIAL/PLATELET - Abnormal; Notable for the following:    WBC 3.0 (*)    Hemoglobin 11.6 (*)    MCH 24.8 (*)    All other components within normal limits  URINALYSIS, ROUTINE W REFLEX MICROSCOPIC - Abnormal; Notable for the following:    APPearance CLOUDY (*)    Leukocytes, UA SMALL (*)    All other  components within normal limits  URINE MICROSCOPIC-ADD ON - Abnormal; Notable for the following:    Squamous Epithelial / LPF FEW (*)    Bacteria, UA FEW (*)    All other components within normal limits  COMPREHENSIVE METABOLIC PANEL  PREGNANCY, URINE    Imaging Review No results found.   EKG Interpretation None      MDM  Pt given Iv fluids, zofran   Pt tolerating oral fluids.   Pt counseled on viral illness.   I advised follow up with Primary MD for recheck in 2-3 days if not improved.    Final diagnoses:  Vomiting and diarrhea    rx for  zofran odrt    Elson Areas, PA-C 08/01/14 1221  Enid Skeens, MD 08/01/14 [phone]

## 2014-08-01 NOTE — ED Notes (Signed)
Pt reports abd pain and n/v/d since yesterday morning.

## 2014-08-01 NOTE — Discharge Instructions (Signed)

## 2014-08-10 ENCOUNTER — Encounter (HOSPITAL_COMMUNITY): Payer: Self-pay | Admitting: Emergency Medicine

## 2014-08-10 ENCOUNTER — Emergency Department (HOSPITAL_COMMUNITY)
Admission: EM | Admit: 2014-08-10 | Discharge: 2014-08-10 | Disposition: A | Discharge status: Home / Self Care | Payer: No Typology Code available for payment source | Attending: Emergency Medicine | Admitting: Emergency Medicine

## 2014-08-10 DIAGNOSIS — Z87891 Personal history of nicotine dependence: Secondary | ICD-10-CM | POA: Insufficient documentation

## 2014-08-10 DIAGNOSIS — Z792 Long term (current) use of antibiotics: Secondary | ICD-10-CM | POA: Insufficient documentation

## 2014-08-10 DIAGNOSIS — Z8611 Personal history of tuberculosis: Secondary | ICD-10-CM | POA: Insufficient documentation

## 2014-08-10 DIAGNOSIS — Z79899 Other long term (current) drug therapy: Secondary | ICD-10-CM | POA: Insufficient documentation

## 2014-08-10 DIAGNOSIS — Z3202 Encounter for pregnancy test, result negative: Secondary | ICD-10-CM | POA: Insufficient documentation

## 2014-08-10 DIAGNOSIS — R109 Unspecified abdominal pain: Secondary | ICD-10-CM | POA: Insufficient documentation

## 2014-08-10 DIAGNOSIS — Z8709 Personal history of other diseases of the respiratory system: Secondary | ICD-10-CM | POA: Insufficient documentation

## 2014-08-10 DIAGNOSIS — R10A1 Flank pain, right side: Secondary | ICD-10-CM

## 2014-08-10 LAB — URINALYSIS, ROUTINE W REFLEX MICROSCOPIC
Bilirubin Urine: NEGATIVE
Glucose, UA: NEGATIVE mg/dL
HGB URINE DIPSTICK: NEGATIVE
Ketones, ur: NEGATIVE mg/dL
Nitrite: NEGATIVE
PROTEIN: NEGATIVE mg/dL
SPECIFIC GRAVITY, URINE: 1.021 (ref 1.005–1.030)
UROBILINOGEN UA: 1 mg/dL (ref 0.0–1.0)
pH: 7 (ref 5.0–8.0)

## 2014-08-10 LAB — URINE MICROSCOPIC-ADD ON

## 2014-08-10 LAB — POC URINE PREG, ED: PREG TEST UR: NEGATIVE

## 2014-08-10 MED ORDER — IBUPROFEN 400 MG PO TABS
400.0000 mg | ORAL_TABLET | Freq: Once | ORAL | Status: AC
  Administered 2014-08-10: 400 mg via ORAL
  Filled 2014-08-10: qty 1

## 2014-08-10 MED ORDER — CIPROFLOXACIN HCL 500 MG PO TABS
500.0000 mg | ORAL_TABLET | Freq: Once | ORAL | Status: AC
  Administered 2014-08-10: 500 mg via ORAL
  Filled 2014-08-10: qty 1

## 2014-08-10 MED ORDER — TRAMADOL HCL 50 MG PO TABS
50.0000 mg | ORAL_TABLET | Freq: Once | ORAL | Status: AC
  Administered 2014-08-10: 50 mg via ORAL
  Filled 2014-08-10: qty 1

## 2014-08-10 MED ORDER — CIPROFLOXACIN HCL 500 MG PO TABS: 500.0000 mg | ORAL_TABLET | Freq: Two times a day (BID) | ORAL | Status: DC

## 2014-08-10 MED ORDER — TRAMADOL HCL 50 MG PO TABS
50.0000 mg | ORAL_TABLET | Freq: Four times a day (QID) | ORAL | Status: DC | PRN
Start: 2014-08-10 — End: 2015-01-11

## 2014-08-10 NOTE — ED Notes (Signed)
Pt c/o right flank pain x's 2 days.  Denies any urinary symptoms.  C/o nausea without vomiting

## 2014-08-10 NOTE — ED Provider Notes (Signed)
CSN: [id number]     Arrival date & time 08/10/14  1632 History   First MD Initiated Contact with Patient 08/10/14 1845     Chief Complaint  Patient presents with  . Flank Pain     (Consider location/radiation/quality/duration/timing/severity/associated sxs/prior Treatment) Patient is a 19 y.o. female presenting with flank pain. The history is provided by the patient.  Flank Pain Pertinent negatives include no chest pain, no abdominal pain, no headaches and no shortness of breath.  pt c/o right flank pain posteriorly for the past 2 days. Pain constant, dull, moderate, non radiating. No specific exacerbating or alleviating factors. No hx same pain. No dysuria or hematuria. No hx kidney stones or pyelo. No back injury or strain. No radicular pain. No anterior or abd pain. No pelvic pain. No vaginal discharge or bleeding. No cough or uri c/o. No chest pain. No fever or chills. Normal appetite.      Past Medical History  Diagnosis Date  . TB (pulmonary tuberculosis)     4 months ago 2015  . Strep throat    Past Surgical History  Procedure Laterality Date  . Knee surgery     Family History  Problem Relation Age of Onset  . Hypertension Mother   . Diabetes Mother    History  Substance Use Topics  . Smoking status: Former Games developermoker  . Smokeless tobacco: Not on file  . Alcohol Use: Yes     Comment: occ   OB History    No data available     Review of Systems  Constitutional: Negative for fever and chills.  HENT: Negative for sore throat.   Eyes: Negative for redness.  Respiratory: Negative for shortness of breath.   Cardiovascular: Negative for chest pain.  Gastrointestinal: Negative for vomiting, abdominal pain and diarrhea.  Endocrine: Negative for polyuria.  Genitourinary: Positive for flank pain. Negative for dysuria and hematuria.  Musculoskeletal: Negative for back pain and neck pain.  Skin: Negative for rash.  Neurological: Negative for headaches.  Hematological: Does  not bruise/bleed easily.  Psychiatric/Behavioral: Negative for confusion.      Allergies  Review of patient's allergies indicates no known allergies.  Home Medications   Prior to Admission medications   Medication Sig Start Date End Date Taking? Authorizing Provider  cephALEXin (KEFLEX) 500 MG capsule Take 1 capsule (500 mg total) by mouth 2 (two) times daily. 07/11/14   Nicole Pisciotta, PA-C  ondansetron (ZOFRAN ODT) 4 MG disintegrating tablet Take 1 tablet (4 mg total) by mouth every 8 (eight) hours as needed for nausea or vomiting. 08/01/14   Elson AreasLeslie K Sofia, PA-C  phenazopyridine (PYRIDIUM) 200 MG tablet Take 1 tablet (200 mg total) by mouth 3 (three) times daily. 07/11/14   Nicole Pisciotta, PA-C  sertraline (ZOLOFT) 50 MG tablet Take 1 tablet (50 mg total) by mouth daily. 03/25/14   Gerhard Munchobert Lockwood, MD   BP 115/66 mmHg  Pulse 94  Temp(Src) 99.6 F (37.6 C)  Resp 20  SpO2 95%  LMP 07/28/2014 Physical Exam  Constitutional: She appears well-developed and well-nourished. No distress.  HENT:  Mouth/Throat: Oropharynx is clear and moist.  Eyes: Conjunctivae are normal. No scleral icterus.  Neck: Neck supple. No tracheal deviation present.  Cardiovascular: Normal rate.   Pulmonary/Chest: Effort normal and breath sounds normal. No respiratory distress.  Abdominal: Soft. Normal appearance and bowel sounds are normal. She exhibits no distension and no mass. There is no tenderness. There is no rebound and no guarding.  Genitourinary:  Right  cva tenderness. Spine nt. No focal muscular tenderness.   Musculoskeletal: She exhibits no edema or tenderness.  tls spine nt.   Neurological: She is alert.  Skin: Skin is warm and dry. No rash noted. She is not diaphoretic.  No shingles/rash in area of pain.     Psychiatric: She has a normal mood and affect.  Nursing note and vitals reviewed.   ED Course  Procedures (including critical care time) Labs Review   Results for orders placed or  performed during the hospital encounter of 08/10/14  Urinalysis, Routine w reflex microscopic  Result Value Ref Range   Color, Urine YELLOW YELLOW   APPearance CLEAR CLEAR   Specific Gravity, Urine 1.021 1.005 - 1.030   pH 7.0 5.0 - 8.0   Glucose, UA NEGATIVE NEGATIVE mg/dL   Hgb urine dipstick NEGATIVE NEGATIVE   Bilirubin Urine NEGATIVE NEGATIVE   Ketones, ur NEGATIVE NEGATIVE mg/dL   Protein, ur NEGATIVE NEGATIVE mg/dL   Urobilinogen, UA 1.0 0.0 - 1.0 mg/dL   Nitrite NEGATIVE NEGATIVE   Leukocytes, UA SMALL (A) NEGATIVE  Urine microscopic-add on  Result Value Ref Range   Squamous Epithelial / LPF FEW (A) RARE   WBC, UA 3-6 <3 WBC/hpf   RBC / HPF 0-2 <3 RBC/hpf   Bacteria, UA MANY (A) RARE  POC Urine Pregnancy, ED (do NOT order at [facility])  Result Value Ref Range   Preg Test, Ur NEGATIVE NEGATIVE      MDM   Labs.  Pt has ride, does not have to drive.  Motrin po. Ultram po.  Reviewed nursing notes and prior charts for additional history.   Pt w consistent/reproducible right cva tenderness. ua has many bact, and small LE, t 99.6.  Pt w uti 1 month ago.  Last ucx then pos e coli, sens cipro   cipro po.  Recheck pt comfortable. Tolerating po fluids.   Pt currently appears stable for d/c.      Suzi Roots, MD 08/10/14 2059

## 2014-08-10 NOTE — ED Notes (Signed)
Family at bedside. 

## 2014-08-10 NOTE — Discharge Instructions (Signed)
It was our pleasure to provide your ER care today - we hope that you feel better.  Rest. Drink plenty of fluids.  Take cipro (antibiotic) as prescribed.  Take motrin or aleve as need for pain.  You may also take ultram as need for pain - no driving when taking.  Follow up with primary care doctor in the next few days.  A urine culture was sent today the results of which will be back in 2-3 days - have your doctor follow up on those results then.  If recurrent urinary infections, follow up with urologist in the next couple weeks.  Return to ER if worse, new symptoms, persistent vomiting, worsening or severe pain, other concern.    Flank Pain Flank pain refers to pain that is located on the side of the body between the upper abdomen and the back. The pain may occur over a short period of time (acute) or may be long-term or reoccurring (chronic). It may be mild or severe. Flank pain can be caused by many things. CAUSES  Some of the more common causes of flank pain include:  Muscle strains.   Muscle spasms.   A disease of your spine (vertebral disk disease).   A lung infection (pneumonia).   Fluid around your lungs (pulmonary edema).   A kidney infection.   Kidney stones.   A very painful skin rash caused by the chickenpox virus (shingles).   Gallbladder disease.  HOME CARE INSTRUCTIONS  Home care will depend on the cause of your pain. In general,  Rest as directed by your caregiver.  Drink enough fluids to keep your urine clear or pale yellow.  Only take over-the-counter or prescription medicines as directed by your caregiver. Some medicines may help relieve the pain.  Tell your caregiver about any changes in your pain.  Follow up with your caregiver as directed. SEEK IMMEDIATE MEDICAL CARE IF:   Your pain is not controlled with medicine.   You have new or worsening symptoms.  Your pain increases.   You have abdominal pain.   You have shortness  of breath.   You have persistent nausea or vomiting.   You have swelling in your abdomen.   You feel faint or pass out.   You have blood in your urine.  You have a fever or persistent symptoms for more than 2-3 days.  You have a fever and your symptoms suddenly get worse. MAKE SURE YOU:   Understand these instructions.  Will watch your condition.  Will get help right away if you are not doing well or get worse. Document Released: 08/15/2005 Document Revised: 03/18/2012 Document Reviewed: 02/06/2012 [facility] Patient Information 2015 [place], [place]. This information is not intended to replace advice given to you by your health care provider. Make sure you discuss any questions you have with your health care provider.    Pyelonephritis, Adult Pyelonephritis is a kidney infection. In general, there are 2 main types of pyelonephritis:  Infections that come on quickly without any warning (acute pyelonephritis).  Infections that persist for a long period of time (chronic pyelonephritis). CAUSES  Two main causes of pyelonephritis are:  Bacteria traveling from the bladder to the kidney. This is a problem especially in pregnant women. The urine in the bladder can become filled with bacteria from multiple causes, including:  Inflammation of the prostate gland (prostatitis).  Sexual intercourse in females.  Bladder infection (cystitis).  Bacteria traveling from the bloodstream to the tissue part of the kidney.  Problems that may increase your risk of getting a kidney infection include:  Diabetes.  Kidney stones or bladder stones.  Cancer.  Catheters placed in the bladder.  Other abnormalities of the kidney or ureter. SYMPTOMS   Abdominal pain.  Pain in the side or flank area.  Fever.  Chills.  Upset stomach.  Blood in the urine (dark urine).  Frequent urination.  Strong or persistent urge to urinate.  Burning or stinging when urinating. DIAGNOSIS    Your caregiver may diagnose your kidney infection based on your symptoms. A urine sample may also be taken. TREATMENT  In general, treatment depends on how severe the infection is.   If the infection is mild and caught early, your caregiver may treat you with oral antibiotics and send you home.  If the infection is more severe, the bacteria may have gotten into the bloodstream. This will require intravenous (IV) antibiotics and a hospital stay. Symptoms may include:  High fever.  Severe flank pain.  Shaking chills.  Even after a hospital stay, your caregiver may require you to be on oral antibiotics for a period of time.  Other treatments may be required depending upon the cause of the infection. HOME CARE INSTRUCTIONS   Take your antibiotics as directed. Finish them even if you start to feel better.  Make an appointment to have your urine checked to make sure the infection is gone.  Drink enough fluids to keep your urine clear or pale yellow.  Take medicines for the bladder if you have urgency and frequency of urination as directed by your caregiver. SEEK IMMEDIATE MEDICAL CARE IF:   You have a fever or persistent symptoms for more than 2-3 days.  You have a fever and your symptoms suddenly get worse.  You are unable to take your antibiotics or fluids.  You develop shaking chills.  You experience extreme weakness or fainting.  There is no improvement after 2 days of treatment. MAKE SURE YOU:  Understand these instructions.  Will watch your condition.  Will get help right away if you are not doing well or get worse. Document Released: 06/24/2005 Document Revised: 12/24/2011 Document Reviewed: 11/28/2010 [facility] Patient Information 2015 [place]. This information is not intended to replace advice given to you by your health care provider. Make sure you discuss any questions you have with your health care provider.

## 2014-08-10 NOTE — ED Notes (Signed)
Patient is alert and orientedx4.  Patient was explained discharge instructions and they understood them with no questions.   

## 2014-08-11 ENCOUNTER — Emergency Department (HOSPITAL_COMMUNITY): Payer: BLUE CROSS/BLUE SHIELD

## 2014-08-11 ENCOUNTER — Encounter (HOSPITAL_COMMUNITY): Payer: Self-pay | Admitting: *Deleted

## 2014-08-11 ENCOUNTER — Emergency Department (HOSPITAL_COMMUNITY)
Admission: EM | Admit: 2014-08-11 | Discharge: 2014-08-11 | Disposition: A | Discharge status: Home / Self Care | Payer: BLUE CROSS/BLUE SHIELD | Attending: Emergency Medicine | Admitting: Emergency Medicine

## 2014-08-11 DIAGNOSIS — R112 Nausea with vomiting, unspecified: Secondary | ICD-10-CM | POA: Diagnosis present

## 2014-08-11 DIAGNOSIS — Z8709 Personal history of other diseases of the respiratory system: Secondary | ICD-10-CM | POA: Insufficient documentation

## 2014-08-11 DIAGNOSIS — R109 Unspecified abdominal pain: Secondary | ICD-10-CM

## 2014-08-11 DIAGNOSIS — Z8611 Personal history of tuberculosis: Secondary | ICD-10-CM | POA: Insufficient documentation

## 2014-08-11 DIAGNOSIS — R042 Hemoptysis: Secondary | ICD-10-CM | POA: Diagnosis not present

## 2014-08-11 DIAGNOSIS — Z87448 Personal history of other diseases of urinary system: Secondary | ICD-10-CM | POA: Diagnosis not present

## 2014-08-11 DIAGNOSIS — N838 Other noninflammatory disorders of ovary, fallopian tube and broad ligament: Secondary | ICD-10-CM | POA: Insufficient documentation

## 2014-08-11 DIAGNOSIS — Z87891 Personal history of nicotine dependence: Secondary | ICD-10-CM | POA: Insufficient documentation

## 2014-08-11 DIAGNOSIS — N83209 Unspecified ovarian cyst, unspecified side: Secondary | ICD-10-CM

## 2014-08-11 LAB — CBC WITH DIFFERENTIAL/PLATELET
BASOS PCT: 0 % (ref 0–1)
Basophils Absolute: 0 10*3/uL (ref 0.0–0.1)
EOS PCT: 1 % (ref 0–5)
Eosinophils Absolute: 0.1 10*3/uL (ref 0.0–0.7)
HCT: 32.5 % — ABNORMAL LOW (ref 36.0–46.0)
Hemoglobin: 10.3 g/dL — ABNORMAL LOW (ref 12.0–15.0)
Lymphocytes Relative: 13 % (ref 12–46)
Lymphs Abs: 0.7 10*3/uL (ref 0.7–4.0)
MCH: 24.5 pg — AB (ref 26.0–34.0)
MCHC: 31.7 g/dL (ref 30.0–36.0)
MCV: 77.4 fL — AB (ref 78.0–100.0)
MONOS PCT: 14 % — AB (ref 3–12)
Monocytes Absolute: 0.8 10*3/uL (ref 0.1–1.0)
Neutro Abs: 3.9 10*3/uL (ref 1.7–7.7)
Neutrophils Relative %: 72 % (ref 43–77)
Platelets: 186 10*3/uL (ref 150–400)
RBC: 4.2 MIL/uL (ref 3.87–5.11)
RDW: 13.9 % (ref 11.5–15.5)
WBC: 5.5 10*3/uL (ref 4.0–10.5)

## 2014-08-11 LAB — COMPREHENSIVE METABOLIC PANEL
ALT: 10 U/L (ref 0–35)
ANION GAP: 9 (ref 5–15)
AST: 15 U/L (ref 0–37)
Albumin: 3.3 g/dL — ABNORMAL LOW (ref 3.5–5.2)
Alkaline Phosphatase: 50 U/L (ref 39–117)
BUN: 10 mg/dL (ref 6–23)
CHLORIDE: 103 mmol/L (ref 96–112)
CO2: 26 mmol/L (ref 19–32)
Calcium: 8.8 mg/dL (ref 8.4–10.5)
Creatinine, Ser: 0.86 mg/dL (ref 0.50–1.10)
Glucose, Bld: 90 mg/dL (ref 70–99)
POTASSIUM: 4.2 mmol/L (ref 3.5–5.1)
SODIUM: 138 mmol/L (ref 135–145)
Total Bilirubin: 0.3 mg/dL (ref 0.3–1.2)
Total Protein: 7 g/dL (ref 6.0–8.3)

## 2014-08-11 LAB — LIPASE, BLOOD: Lipase: 29 U/L (ref 11–59)

## 2014-08-11 MED ORDER — FENTANYL CITRATE 0.05 MG/ML IJ SOLN
50.0000 ug | Freq: Once | INTRAMUSCULAR | Status: AC
  Administered 2014-08-11: 50 ug via INTRAVENOUS
  Filled 2014-08-11: qty 2

## 2014-08-11 MED ORDER — SODIUM CHLORIDE 0.9 % IV BOLUS (SEPSIS)
1000.0000 mL | Freq: Once | INTRAVENOUS | Status: AC
  Administered 2014-08-11: 1000 mL via INTRAVENOUS

## 2014-08-11 MED ORDER — PROMETHAZINE HCL 25 MG PO TABS: 25.0000 mg | ORAL_TABLET | Freq: Four times a day (QID) | ORAL | Status: DC | PRN

## 2014-08-11 MED ORDER — ONDANSETRON HCL 4 MG/2ML IJ SOLN
4.0000 mg | Freq: Once | INTRAMUSCULAR | Status: AC
  Administered 2014-08-11: 4 mg via INTRAVENOUS
  Filled 2014-08-11: qty 2

## 2014-08-11 NOTE — Discharge Instructions (Signed)
Ovarian Cyst °An ovarian cyst is a sac filled with fluid or blood. This sac is attached to the ovary. Some cysts go away on their own. Other cysts need treatment.  °HOME CARE  °· Only take medicine as told by your doctor. °· Follow up with your doctor as told. °· Get regular pelvic exams and Pap tests. °GET HELP IF: °· Your periods are late, not regular, or painful. °· You stop having periods. °· Your belly (abdominal) or pelvic pain does not go away. °· Your belly becomes large or puffy (swollen). °· You have a hard time peeing (totally emptying your bladder). °· You have pressure on your bladder. °· You have pain during sex. °· You feel fullness, pressure, or discomfort in your belly. °· You lose weight for no reason. °· You feel sick most of the time. °· You have a hard time pooping (constipation). °· You do not feel like eating. °· You develop pimples (acne). °· You have an increase in hair on your body and face. °· You are gaining weight for no reason. °· You think you are pregnant. °GET HELP RIGHT AWAY IF:  °· Your belly pain gets worse. °· You feel sick to your stomach (nauseous), and you throw up (vomit). °· You have a fever that comes on fast. °· You have belly pain while pooping (bowel movement). °· Your periods are heavier than usual. °MAKE SURE YOU:  °· Understand these instructions. °· Will watch your condition. °· Will get help right away if you are not doing well or get worse. °Document Released: 12/11/2007 Document Revised: 04/14/2013 Document Reviewed: 03/01/2013 °ExitCare® Patient Information ©2015 [employer]. This information is not intended to replace advice given to you by your health care provider. Make sure you discuss any questions you have with your health care provider. ° °

## 2014-08-11 NOTE — ED Notes (Signed)
Patient was seen here on yesterday.  Dx with kidney infection.   Today she had onset of nausea and vomitted x 2.  She reports she saw some blood in her emesis.  Patient was told to return if she developed n/v.  Patient is complaining of pain in her right flank.

## 2014-08-11 NOTE — ED Provider Notes (Signed)
CSN: [id number]     Arrival date & time 08/11/14  4098 History   First MD Initiated Contact with Patient 08/11/14 1012     Chief Complaint  Patient presents with  . Emesis  . Hemoptysis  . Nausea  . Back Pain      HPI Patient was seen here on yesterday. Dx with kidney infection. Today she had onset of nausea and vomitted x 2. She reports she saw some blood in her emesis. Patient was told to return if she developed n/v. Patient is complaining of pain in her right flank Past Medical History  Diagnosis Date  . TB (pulmonary tuberculosis)     4 months ago 2015  . Strep throat    Past Surgical History  Procedure Laterality Date  . Knee surgery     Family History  Problem Relation Age of Onset  . Hypertension Mother   . Diabetes Mother    History  Substance Use Topics  . Smoking status: Former Games developer  . Smokeless tobacco: Not on file  . Alcohol Use: Yes     Comment: occ   OB History    No data available     Review of Systems  All other systems reviewed and are negative  Allergies  Review of patient's allergies indicates no known allergies.  Home Medications   Prior to Admission medications   Medication Sig Start Date End Date Taking? Authorizing Provider  ciprofloxacin (CIPRO) 500 MG tablet Take 1 tablet (500 mg total) by mouth 2 (two) times daily. 08/10/14  Yes Suzi Roots, MD  traMADol (ULTRAM) 50 MG tablet Take 1 tablet (50 mg total) by mouth every 6 (six) hours as needed. 08/10/14  Yes Suzi Roots, MD  cephALEXin (KEFLEX) 500 MG capsule Take 1 capsule (500 mg total) by mouth 2 (two) times daily. Patient not taking: Reported on 08/10/2014 07/11/14   Joni Reining Pisciotta, PA-C  ondansetron (ZOFRAN ODT) 4 MG disintegrating tablet Take 1 tablet (4 mg total) by mouth every 8 (eight) hours as needed for nausea or vomiting. Patient not taking: Reported on 08/10/2014 08/01/14   Elson Areas, PA-C  phenazopyridine (PYRIDIUM) 200 MG tablet Take 1 tablet (200 mg total) by  mouth 3 (three) times daily. Patient not taking: Reported on 08/10/2014 07/11/14   Joni Reining Pisciotta, PA-C  promethazine (PHENERGAN) 25 MG tablet Take 1 tablet (25 mg total) by mouth every 6 (six) hours as needed for nausea or vomiting. 08/11/14   Nelia Shi, MD  sertraline (ZOLOFT) 50 MG tablet Take 1 tablet (50 mg total) by mouth daily. Patient not taking: Reported on 08/10/2014 03/25/14   Gerhard Munch, MD   BP 103/64 mmHg  Pulse 62  Temp(Src) 98.7 F (37.1 C) (Oral)  Resp 16  Ht  (1.6 m)  Wt 167 lb 12 oz (76.091 kg)  BMI 29.72 kg/m2  SpO2 100%  LMP 07/28/2014 Physical Exam Physical Exam  Nursing note and vitals reviewed. Constitutional: She is oriented to person, place, and time. She appears well-developed and well-nourished. No distress.  HENT:  Head: Normocephalic and atraumatic.  Eyes: Pupils are equal, round, and reactive to light.  Neck: Normal range of motion.  Cardiovascular: Normal rate and intact distal pulses.   Pulmonary/Chest: No respiratory distress.  Abdominal: Normal appearance. She exhibits no distension.  mild flank pain to percussion of right CVA. Musculoskeletal: Normal range of motion.  Neurological: She is alert and oriented to person, place, and time. No cranial nerve deficit.  Skin: Skin is warm and dry. No rash noted.  Psychiatric: She has a normal mood and affect. Her behavior is normal.   ED Course  Procedures (including critical care time) Medications  sodium chloride 0.9 % bolus 1,000 mL (0 mLs Intravenous Stopped 08/11/14 1138)  ondansetron (ZOFRAN) injection 4 mg (4 mg Intravenous Given 08/11/14 1033)  fentaNYL (SUBLIMAZE) injection 50 mcg (50 mcg Intravenous Given 08/11/14 1033)    Labs Review Labs Reviewed  CBC WITH DIFFERENTIAL/PLATELET - Abnormal; Notable for the following:    Hemoglobin 10.3 (*)    HCT 32.5 (*)    MCV 77.4 (*)    MCH 24.5 (*)    Monocytes Relative 14 (*)    All other components within normal limits  COMPREHENSIVE  METABOLIC PANEL - Abnormal; Notable for the following:    Albumin 3.3 (*)    All other components within normal limits  LIPASE, BLOOD    Imaging Review Ct Renal Stone Study  08/11/2014   CLINICAL DATA:  Three day history of right flank pain with nausea  EXAM: CT ABDOMEN AND PELVIS WITHOUT CONTRAST  TECHNIQUE: Multidetector CT imaging of the abdomen and pelvis was performed following the standard protocol without oral or intravenous contrast material administration.  COMPARISON:  None.  FINDINGS: There is mild bibasilar lung atelectatic change.  No focal liver lesions are identified on this noncontrast enhanced study. Gallbladder wall is not thickened. There is no appreciable biliary duct dilatation.  There is a granuloma in the posterior spleen. The spleen is prominent, measuring 14.8 x 10.8 x 7.2 cm. There is no perisplenic fluid.  Pancreas and adrenals appear within normal limits. Kidneys bilaterally show no mass or hydronephrosis on either side. There is no demonstrable renal or ureteral calculus on either side.  In the pelvis, the urinary bladder is midline with normal wall thickness. A small amount of free fluid is noted in the cul-de-sac. There is no pelvic mass. Appendix appears normal.  There is no bowel obstruction. No free air or portal venous air. There is no appreciable ascites, adenopathy, or abscess in the abdomen or pelvis. Aorta appears unremarkable. There are no blastic or lytic bone lesions.  IMPRESSION: Prominent spleen. Small calcification in the spleen which could represent residua of old trauma.  No renal or ureteral calculus. No hydronephrosis. Urinary bladder wall thickness normal.  Appendix appears normal.  No bowel obstruction.  No abscess.  Small amount of free fluid in cul-de-sac may be physiologic or could indicate recent ovarian cyst rupture.   Electronically Signed   By: Bretta BangWilliam  Woodruff M.D.   On: 08/11/2014 11:37    Reviewing her urine results from yesterday I am not sure  that she had a urinary tract infection.  I suspect her symptoms were probably secondary to a ruptured ovarian cyst.  MDM   Final diagnoses:  Flank pain  Ruptured ovarian cyst        Nelia Shiobert L Cailah Reach, MD 08/11/14 1233

## 2014-09-14 ENCOUNTER — Emergency Department (HOSPITAL_COMMUNITY)
Admission: EM | Admit: 2014-09-14 | Discharge: 2014-09-14 | Disposition: A | Discharge status: Home / Self Care | Payer: BLUE CROSS/BLUE SHIELD | Attending: Emergency Medicine | Admitting: Emergency Medicine

## 2014-09-14 ENCOUNTER — Encounter (HOSPITAL_COMMUNITY): Payer: Self-pay | Admitting: *Deleted

## 2014-09-14 DIAGNOSIS — Z8709 Personal history of other diseases of the respiratory system: Secondary | ICD-10-CM | POA: Insufficient documentation

## 2014-09-14 DIAGNOSIS — Z8611 Personal history of tuberculosis: Secondary | ICD-10-CM | POA: Insufficient documentation

## 2014-09-14 DIAGNOSIS — Z3202 Encounter for pregnancy test, result negative: Secondary | ICD-10-CM | POA: Insufficient documentation

## 2014-09-14 DIAGNOSIS — R109 Unspecified abdominal pain: Secondary | ICD-10-CM | POA: Insufficient documentation

## 2014-09-14 DIAGNOSIS — Z87448 Personal history of other diseases of urinary system: Secondary | ICD-10-CM | POA: Insufficient documentation

## 2014-09-14 DIAGNOSIS — Z87891 Personal history of nicotine dependence: Secondary | ICD-10-CM | POA: Insufficient documentation

## 2014-09-14 DIAGNOSIS — Z792 Long term (current) use of antibiotics: Secondary | ICD-10-CM | POA: Insufficient documentation

## 2014-09-14 DIAGNOSIS — Z87440 Personal history of urinary (tract) infections: Secondary | ICD-10-CM | POA: Insufficient documentation

## 2014-09-14 DIAGNOSIS — N926 Irregular menstruation, unspecified: Secondary | ICD-10-CM | POA: Insufficient documentation

## 2014-09-14 LAB — I-STAT CHEM 8, ED
BUN: 12 mg/dL (ref 6–23)
CHLORIDE: 104 mmol/L (ref 96–112)
Calcium, Ion: 1.18 mmol/L (ref 1.12–1.23)
Creatinine, Ser: 0.7 mg/dL (ref 0.50–1.10)
Glucose, Bld: 94 mg/dL (ref 70–99)
HEMATOCRIT: 38 % (ref 36.0–46.0)
Hemoglobin: 12.9 g/dL (ref 12.0–15.0)
Potassium: 4.2 mmol/L (ref 3.5–5.1)
SODIUM: 138 mmol/L (ref 135–145)
TCO2: 22 mmol/L (ref 0–100)

## 2014-09-14 LAB — URINALYSIS, ROUTINE W REFLEX MICROSCOPIC
Glucose, UA: NEGATIVE mg/dL
Hgb urine dipstick: NEGATIVE
Ketones, ur: 15 mg/dL — AB
NITRITE: NEGATIVE
PH: 6 (ref 5.0–8.0)
Protein, ur: NEGATIVE mg/dL
Specific Gravity, Urine: 1.028 (ref 1.005–1.030)
UROBILINOGEN UA: 0.2 mg/dL (ref 0.0–1.0)

## 2014-09-14 LAB — CBC
HEMATOCRIT: 32.6 % — AB (ref 36.0–46.0)
Hemoglobin: 10.5 g/dL — ABNORMAL LOW (ref 12.0–15.0)
MCH: 24.3 pg — AB (ref 26.0–34.0)
MCHC: 32.2 g/dL (ref 30.0–36.0)
MCV: 75.5 fL — AB (ref 78.0–100.0)
Platelets: 178 10*3/uL (ref 150–400)
RBC: 4.32 MIL/uL (ref 3.87–5.11)
RDW: 13.9 % (ref 11.5–15.5)
WBC: 4.5 10*3/uL (ref 4.0–10.5)

## 2014-09-14 LAB — URINE MICROSCOPIC-ADD ON

## 2014-09-14 LAB — POC URINE PREG, ED: PREG TEST UR: NEGATIVE

## 2014-09-14 MED ORDER — OXYCODONE-ACETAMINOPHEN 5-325 MG PO TABS
1.0000 | ORAL_TABLET | Freq: Once | ORAL | Status: AC
  Administered 2014-09-14: 1 via ORAL
  Filled 2014-09-14: qty 1

## 2014-09-14 MED ORDER — ONDANSETRON 4 MG PO TBDP
4.0000 mg | ORAL_TABLET | Freq: Once | ORAL | Status: AC
  Administered 2014-09-14: 4 mg via ORAL
  Filled 2014-09-14: qty 1

## 2014-09-14 NOTE — Discharge Instructions (Signed)
Your Urinalysis appears normal and there is no sign of infection.  Your Urine pregnancy test was negative. Take tylenol or advil for you pain.  Follow up with your primary care doctor for further evaluation.   Flank Pain Flank pain refers to pain that is located on the side of the body between the upper abdomen and the back. The pain may occur over a short period of time (acute) or may be long-term or reoccurring (chronic). It may be mild or severe. Flank pain can be caused by many things. CAUSES  Some of the more common causes of flank pain include:  Muscle strains.   Muscle spasms.   A disease of your spine (vertebral disk disease).   A lung infection (pneumonia).   Fluid around your lungs (pulmonary edema).   A kidney infection.   Kidney stones.   A very painful skin rash caused by the chickenpox virus (shingles).   Gallbladder disease.  HOME CARE INSTRUCTIONS  Home care will depend on the cause of your pain. In general,  Rest as directed by your caregiver.  Drink enough fluids to keep your urine clear or pale yellow.  Only take over-the-counter or prescription medicines as directed by your caregiver. Some medicines may help relieve the pain.  Tell your caregiver about any changes in your pain.  Follow up with your caregiver as directed. SEEK IMMEDIATE MEDICAL CARE IF:   Your pain is not controlled with medicine.   You have new or worsening symptoms.  Your pain increases.   You have abdominal pain.   You have shortness of breath.   You have persistent nausea or vomiting.   You have swelling in your abdomen.   You feel faint or pass out.   You have blood in your urine.  You have a fever or persistent symptoms for more than 2-3 days.  You have a fever and your symptoms suddenly get worse. MAKE SURE YOU:   Understand these instructions.  Will watch your condition.  Will get help right away if you are not doing well or get  worse. Document Released: 08/15/2005 Document Revised: 03/18/2012 Document Reviewed: 02/06/2012 [facility] Patient Information 2015 [place], [place]. This information is not intended to replace advice given to you by your health care provider. Make sure you discuss any questions you have with your health care provider.

## 2014-09-14 NOTE — ED Notes (Signed)
Pt reports right flank pain since Monday. Having nausea and feels dehydrated. Reports hx of kidney infection, denies urinary problems.

## 2014-09-14 NOTE — ED Notes (Signed)
Pt in restroom 

## 2014-09-14 NOTE — ED Notes (Signed)
Pt refused pain meds in past but now requesting them.

## 2014-09-14 NOTE — ED Provider Notes (Signed)
CSN: [id number]     Arrival date & time 09/14/14  1043 History   First MD Initiated Contact with Patient 09/14/14 1137     Chief Complaint  Patient presents with  . Flank Pain     (Consider location/radiation/quality/duration/timing/severity/associated sxs/prior Treatment) HPI   Sarah Norris Is a 19 year old female who presents emergency Department with chief complaint of right flank pain. She has a past medical history of recurrent urinary tract infections and pyelonephritis. Onset of pain was 4 days ago, progressively worsening, does not radiate, rated at 4 out of 10. She denies any urinary symptoms or suprapubic pain. He should also states that she missed her last period. Last menstrual period was 08/08/2014. She has been having breast tenderness and daily nausea. She denies any vaginal symptoms or bleeding.   Past Medical History  Diagnosis Date  . TB (pulmonary tuberculosis)     4 months ago 2015  . Strep throat    Past Surgical History  Procedure Laterality Date  . Knee surgery     Family History  Problem Relation Age of Onset  . Hypertension Mother   . Diabetes Mother    History  Substance Use Topics  . Smoking status: Former Games developer  . Smokeless tobacco: Not on file  . Alcohol Use: Yes     Comment: occ   OB History    No data available     Review of Systems Ten systems reviewed and are negative for acute change, except as noted in the HPI.     Allergies  Review of patient's allergies indicates no known allergies.  Home Medications   Prior to Admission medications   Medication Sig Start Date End Date Taking? Authorizing Provider  cephALEXin (KEFLEX) 500 MG capsule Take 1 capsule (500 mg total) by mouth 2 (two) times daily. Patient not taking: Reported on 08/10/2014 07/11/14   Joni Reining Pisciotta, PA-C  ciprofloxacin (CIPRO) 500 MG tablet Take 1 tablet (500 mg total) by mouth 2 (two) times daily. 08/10/14   Cathren Laine, MD  ondansetron (ZOFRAN ODT) 4 MG  disintegrating tablet Take 1 tablet (4 mg total) by mouth every 8 (eight) hours as needed for nausea or vomiting. Patient not taking: Reported on 08/10/2014 08/01/14   Elson Areas, PA-C  phenazopyridine (PYRIDIUM) 200 MG tablet Take 1 tablet (200 mg total) by mouth 3 (three) times daily. Patient not taking: Reported on 08/10/2014 07/11/14   Joni Reining Pisciotta, PA-C  promethazine (PHENERGAN) 25 MG tablet Take 1 tablet (25 mg total) by mouth every 6 (six) hours as needed for nausea or vomiting. 08/11/14   Nelva Nay, MD  sertraline (ZOLOFT) 50 MG tablet Take 1 tablet (50 mg total) by mouth daily. Patient not taking: Reported on 08/10/2014 03/25/14   Gerhard Munch, MD  traMADol (ULTRAM) 50 MG tablet Take 1 tablet (50 mg total) by mouth every 6 (six) hours as needed. 08/10/14   Cathren Laine, MD   BP 115/70 mmHg  Pulse 81  Temp(Src) 99 F (37.2 C) (Oral)  Resp 18  Ht  (1.6 m)  Wt 167 lb (75.751 kg)  BMI 29.59 kg/m2  SpO2 100%  LMP 08/08/2014 Physical Exam  Constitutional: She is oriented to person, place, and time. She appears well-developed and well-nourished. No distress.  HENT:  Head: Normocephalic and atraumatic.  Eyes: Conjunctivae are normal. No scleral icterus.  Neck: Normal range of motion.  Cardiovascular: Normal rate, regular rhythm and normal heart sounds.  Exam reveals no gallop and no friction rub.  No murmur heard. Pulmonary/Chest: Effort normal and breath sounds normal. No respiratory distress.  Abdominal: Soft. Bowel sounds are normal. She exhibits no distension and no mass. There is no tenderness. There is no guarding.  No CVA tenderness  Neurological: She is alert and oriented to person, place, and time.  Skin: Skin is warm and dry. She is not diaphoretic.  Nursing note and vitals reviewed.   ED Course  Procedures (including critical care time) Labs Review Labs Reviewed  URINALYSIS, ROUTINE W REFLEX MICROSCOPIC - Abnormal; Notable for the following:    Bilirubin  Urine SMALL (*)    Ketones, ur 15 (*)    Leukocytes, UA TRACE (*)    All other components within normal limits  CBC - Abnormal; Notable for the following:    Hemoglobin 10.5 (*)    HCT 32.6 (*)    MCV 75.5 (*)    MCH 24.3 (*)    All other components within normal limits  URINE MICROSCOPIC-ADD ON - Abnormal; Notable for the following:    Squamous Epithelial / LPF FEW (*)    Bacteria, UA FEW (*)    All other components within normal limits  POC URINE PREG, ED  I-STAT CHEM 8, ED    Imaging Review No results found.   EKG Interpretation None      MDM   Final diagnoses:  Flank pain  Negative pregnancy test  Missed menses     Patient with negative urinalysis for infection. Small amount of bilirubin. Trace ketones and leukocytes. She has anemia which appears to be at baseline. Negative urine pregnancy. Patient will be discharged to follow-up with her primary care physician. OTC meds for pain control.   Arthor Captainbigail Zondra Lawlor, PA-C 09/14/14 1256  Vanetta MuldersScott Zackowski, MD 09/14/14 1310

## 2014-09-14 NOTE — ED Notes (Signed)
PT ambulated with baseline gait; VSS; A&Ox3; no signs of distress; respirations even and unlabored; skin warm and dry; no questions upon discharge.  

## 2014-09-15 ENCOUNTER — Encounter (HOSPITAL_COMMUNITY): Payer: Self-pay | Admitting: *Deleted

## 2014-09-15 ENCOUNTER — Emergency Department (HOSPITAL_COMMUNITY)
Admission: EM | Admit: 2014-09-15 | Discharge: 2014-09-15 | Disposition: A | Discharge status: Home / Self Care | Payer: BLUE CROSS/BLUE SHIELD | Attending: Emergency Medicine | Admitting: Emergency Medicine

## 2014-09-15 DIAGNOSIS — B9689 Other specified bacterial agents as the cause of diseases classified elsewhere: Secondary | ICD-10-CM

## 2014-09-15 DIAGNOSIS — Z3202 Encounter for pregnancy test, result negative: Secondary | ICD-10-CM | POA: Insufficient documentation

## 2014-09-15 DIAGNOSIS — D649 Anemia, unspecified: Secondary | ICD-10-CM | POA: Insufficient documentation

## 2014-09-15 DIAGNOSIS — Z8709 Personal history of other diseases of the respiratory system: Secondary | ICD-10-CM | POA: Insufficient documentation

## 2014-09-15 DIAGNOSIS — N760 Acute vaginitis: Secondary | ICD-10-CM | POA: Insufficient documentation

## 2014-09-15 DIAGNOSIS — R109 Unspecified abdominal pain: Secondary | ICD-10-CM

## 2014-09-15 DIAGNOSIS — Z79899 Other long term (current) drug therapy: Secondary | ICD-10-CM | POA: Insufficient documentation

## 2014-09-15 DIAGNOSIS — Z8611 Personal history of tuberculosis: Secondary | ICD-10-CM | POA: Insufficient documentation

## 2014-09-15 DIAGNOSIS — R110 Nausea: Secondary | ICD-10-CM

## 2014-09-15 DIAGNOSIS — N72 Inflammatory disease of cervix uteri: Secondary | ICD-10-CM

## 2014-09-15 DIAGNOSIS — Z87891 Personal history of nicotine dependence: Secondary | ICD-10-CM | POA: Insufficient documentation

## 2014-09-15 DIAGNOSIS — Z792 Long term (current) use of antibiotics: Secondary | ICD-10-CM | POA: Insufficient documentation

## 2014-09-15 LAB — CBC WITH DIFFERENTIAL/PLATELET
BASOS ABS: 0 10*3/uL (ref 0.0–0.1)
Basophils Relative: 0 % (ref 0–1)
EOS ABS: 0.1 10*3/uL (ref 0.0–0.7)
EOS PCT: 2 % (ref 0–5)
HEMATOCRIT: 32.3 % — AB (ref 36.0–46.0)
HEMOGLOBIN: 10.2 g/dL — AB (ref 12.0–15.0)
LYMPHS PCT: 45 % (ref 12–46)
Lymphs Abs: 1.8 10*3/uL (ref 0.7–4.0)
MCH: 24.1 pg — ABNORMAL LOW (ref 26.0–34.0)
MCHC: 31.6 g/dL (ref 30.0–36.0)
MCV: 76.2 fL — ABNORMAL LOW (ref 78.0–100.0)
Monocytes Absolute: 0.4 10*3/uL (ref 0.1–1.0)
Monocytes Relative: 9 % (ref 3–12)
Neutro Abs: 1.8 10*3/uL (ref 1.7–7.7)
Neutrophils Relative %: 45 % (ref 43–77)
Platelets: 190 10*3/uL (ref 150–400)
RBC: 4.24 MIL/uL (ref 3.87–5.11)
RDW: 14.2 % (ref 11.5–15.5)
WBC: 4.1 10*3/uL (ref 4.0–10.5)

## 2014-09-15 LAB — URINALYSIS, ROUTINE W REFLEX MICROSCOPIC
Bilirubin Urine: NEGATIVE
Glucose, UA: NEGATIVE mg/dL
HGB URINE DIPSTICK: NEGATIVE
Ketones, ur: 15 mg/dL — AB
LEUKOCYTES UA: NEGATIVE
NITRITE: NEGATIVE
PH: 5.5 (ref 5.0–8.0)
Protein, ur: NEGATIVE mg/dL
Specific Gravity, Urine: 1.025 (ref 1.005–1.030)
UROBILINOGEN UA: 0.2 mg/dL (ref 0.0–1.0)

## 2014-09-15 LAB — COMPREHENSIVE METABOLIC PANEL
ALBUMIN: 3.6 g/dL (ref 3.5–5.2)
ALK PHOS: 49 U/L (ref 39–117)
ALT: 10 U/L (ref 0–35)
AST: 21 U/L (ref 0–37)
Anion gap: 7 (ref 5–15)
BUN: 8 mg/dL (ref 6–23)
CO2: 25 mmol/L (ref 19–32)
Calcium: 8.8 mg/dL (ref 8.4–10.5)
Chloride: 106 mmol/L (ref 96–112)
Creatinine, Ser: 0.77 mg/dL (ref 0.50–1.10)
GFR calc Af Amer: >90 mL/min (ref >90)
GFR calc non Af Amer: >90 mL/min (ref >90)
GLUCOSE: 97 mg/dL (ref 70–99)
POTASSIUM: 3.9 mmol/L (ref 3.5–5.1)
Sodium: 138 mmol/L (ref 135–145)
Total Bilirubin: 0.6 mg/dL (ref 0.3–1.2)
Total Protein: 6.9 g/dL (ref 6.0–8.3)

## 2014-09-15 LAB — WET PREP, GENITAL
Trich, Wet Prep: NONE SEEN
Yeast Wet Prep HPF POC: NONE SEEN

## 2014-09-15 LAB — POC URINE PREG, ED: Preg Test, Ur: NEGATIVE

## 2014-09-15 LAB — LIPASE, BLOOD: LIPASE: 40 U/L (ref 11–59)

## 2014-09-15 MED ORDER — AZITHROMYCIN 250 MG PO TABS
1000.0000 mg | ORAL_TABLET | Freq: Once | ORAL | Status: AC
  Administered 2014-09-15: 1000 mg via ORAL
  Filled 2014-09-15: qty 4

## 2014-09-15 MED ORDER — HYDROCODONE-ACETAMINOPHEN 5-325 MG PO TABS
1.0000 | ORAL_TABLET | Freq: Once | ORAL | Status: AC
  Administered 2014-09-15: 1 via ORAL
  Filled 2014-09-15: qty 1

## 2014-09-15 MED ORDER — LIDOCAINE HCL (PF) 1 % IJ SOLN
5.0000 mL | Freq: Once | INTRAMUSCULAR | Status: AC
Start: 2014-09-15 — End: 2014-09-15
  Administered 2014-09-15: 5 mL via INTRADERMAL
  Filled 2014-09-15: qty 5

## 2014-09-15 MED ORDER — FERROUS SULFATE 325 (65 FE) MG PO TABS: 325.0000 mg | ORAL_TABLET | Freq: Every day | ORAL | Status: DC

## 2014-09-15 MED ORDER — ONDANSETRON HCL 4 MG PO TABS: 4.0000 mg | ORAL_TABLET | Freq: Four times a day (QID) | ORAL | Status: DC

## 2014-09-15 MED ORDER — CEFTRIAXONE SODIUM 250 MG IJ SOLR
250.0000 mg | Freq: Once | INTRAMUSCULAR | Status: AC
  Administered 2014-09-15: 250 mg via INTRAMUSCULAR
  Filled 2014-09-15: qty 250

## 2014-09-15 MED ORDER — METRONIDAZOLE 500 MG PO TABS
500.0000 mg | ORAL_TABLET | Freq: Once | ORAL | Status: AC
  Administered 2014-09-15: 500 mg via ORAL
  Filled 2014-09-15: qty 1

## 2014-09-15 MED ORDER — ONDANSETRON 4 MG PO TBDP
4.0000 mg | ORAL_TABLET | Freq: Once | ORAL | Status: AC
  Administered 2014-09-15: 4 mg via ORAL
  Filled 2014-09-15: qty 1

## 2014-09-15 MED ORDER — METRONIDAZOLE 500 MG PO TABS: 500.0000 mg | ORAL_TABLET | Freq: Two times a day (BID) | ORAL | Status: DC

## 2014-09-15 NOTE — Discharge Instructions (Signed)
Return to the emergency room with worsening of symptoms, new symptoms or with symptoms that are concerning , especially fevers, abdominal pain in one area, unable to keep down fluids, blood in stool or vomit, severe pain, you feel faint, lightheaded or pass out. Ibuprofen 400mg  (2 tablets 200mg ) every 5-6 hours for 3-5 days. zofran for nausea. Please call your doctor for a followup appointment within 24-48 hours. When you talk to your doctor please let them know that you were seen in the emergency department and have them acquire all of your records so that they can discuss the findings with you and formulate a treatment plan to fully care for your new and ongoing problems.  [facility] as possible for [facility]. Read below information and follow recommendations. Flank Pain Flank pain refers to pain that is located on the side of the body between the upper abdomen and the back. The pain may occur over a short period of time (acute) or may be long-term or reoccurring (chronic). It may be mild or severe. Flank pain can be caused by many things. CAUSES  Some of the more common causes of flank pain include:  Muscle strains.   Muscle spasms.   A disease of your spine (vertebral disk disease).   A lung infection (pneumonia).   Fluid around your lungs (pulmonary edema).   A kidney infection.   Kidney stones.   A very painful skin rash caused by the chickenpox virus (shingles).   Gallbladder disease.  HOME CARE INSTRUCTIONS  Home care will depend on the cause of your pain. In general,  Rest as directed by your caregiver.  Drink enough fluids to keep your urine clear or pale yellow.  Only take over-the-counter or prescription medicines as directed by your caregiver. Some medicines may help relieve the pain.  Tell your caregiver about any changes in your pain.  Follow up with your caregiver as directed. SEEK IMMEDIATE MEDICAL CARE IF:    Your pain is not controlled with medicine.   You have new or worsening symptoms.  Your pain increases.   You have abdominal pain.   You have shortness of breath.   You have persistent nausea or vomiting.   You have swelling in your abdomen.   You feel faint or pass out.   You have blood in your urine.  You have a fever or persistent symptoms for more than 2-3 days.  You have a fever and your symptoms suddenly get worse. MAKE SURE YOU:   Understand these instructions.  Will watch your condition.  Will get help right away if you are not doing well or get worse. Document Released: 08/15/2005 Document Revised: 03/18/2012 Document Reviewed: 02/06/2012 [facility] Patient Information 2015 [place]Care, [place]LL[place]. This information is not intended to replace advice given to you by your health care provider. Make sure you discuss any questions you have with your health care provider. Cervicitis Cervicitis is a soreness and swelling (inflammation) of the cervix. Your cervix is located at the bottom of your uterus. It opens up to the vagina. CAUSES   Sexually transmitted infections (STIs).   Allergic reaction.   Medicines or birth control devices that are put in the vagina.   Injury to the cervix.   Bacterial infections.  RISK FACTORS You are at greater risk if you:  Have unprotected sexual intercourse.  Have sexual intercourse with many partners.  Began sexual intercourse at an early age.  Have a history of STIs. SYMPTOMS  There may be no symptoms. If symptoms occur, they may include:   Gray, white, yellow, or bad-smelling vaginal discharge.   Pain or itching of the area outside the vagina.   Painful sexual intercourse.   Lower abdominal or lower back pain, especially during intercourse.   Frequent urination.   Abnormal vaginal bleeding between periods, after sexual intercourse, or after menopause.   Pressure or a heavy feeling in the pelvis.   DIAGNOSIS  Diagnosis is made after a pelvic exam. Other tests may include:   Examination of any discharge under a microscope (wet prep).   A Pap test.  TREATMENT  Treatment will depend on the cause of cervicitis. If it is caused by an STI, both you and your partner will need to be treated. Antibiotic medicines will be given.  HOME CARE INSTRUCTIONS   Do not have sexual intercourse until your health care provider says it is okay.   Do not have sexual intercourse until your partner has been treated, if your cervicitis is caused by an STI.   Take your antibiotics as directed. Finish them even if you start to feel better.  SEEK MEDICAL CARE IF:  Your symptoms come back.   You have a fever.  MAKE SURE YOU:   Understand these instructions.  Will watch your condition.  Will get help right away if you are not doing well or get worse. Document Released: 06/24/2005 Document Revised: 06/29/2013 Document Reviewed: 12/16/2012 [facility] Patient Information 2015 [place], [place]. This information is not intended to replace advice given to you by your health care provider. Make sure you discuss any questions you have with your health care provider.

## 2014-09-15 NOTE — ED Notes (Signed)
PA Tori at bedside. 

## 2014-09-15 NOTE — ED Notes (Signed)
Phlebotomy at bedside.

## 2014-09-15 NOTE — ED Provider Notes (Signed)
CSN: [id number]     Arrival date & time 09/15/14  1451 History   First MD Initiated Contact with Patient 09/15/14 1705     Chief Complaint  Patient presents with  . Flank Pain     (Consider location/radiation/quality/duration/timing/severity/associated sxs/prior Treatment) HPI  Sarah Norris is a 19 y.o. female presenting with persistent right flank pain. She was seen here yesterday but said the pain had returned. She states it is sharp and constant. It is worse with bending over. Patient endorses some nausea and one episode of nonbloody nonbilious emesis. Last BM yesterday and normal. She denies any fevers or chills. No history of abdominal surgeries. Patient denies any urinary symptoms but does note new onset of left lower quadrant abdominal pain and hasn't ache. She's not taken anything for the symptoms.   Past Medical History  Diagnosis Date  . TB (pulmonary tuberculosis)     4 months ago 2015  . Strep throat    Past Surgical History  Procedure Laterality Date  . Knee surgery     Family History  Problem Relation Age of Onset  . Hypertension Mother   . Diabetes Mother    History  Substance Use Topics  . Smoking status: Former Games developer  . Smokeless tobacco: Not on file  . Alcohol Use: Yes     Comment: occ   OB History    No data available     Review of Systems 10 Systems reviewed and are negative for acute change except as noted in the HPI.    Allergies  Review of patient's allergies indicates no known allergies.  Home Medications   Prior to Admission medications   Medication Sig Start Date End Date Taking? Authorizing Provider  ibuprofen (ADVIL,MOTRIN) 200 MG tablet Take 200 mg by mouth every 6 (six) hours as needed for mild pain or moderate pain.   Yes Historical Provider, MD  cephALEXin (KEFLEX) 500 MG capsule Take 1 capsule (500 mg total) by mouth 2 (two) times daily. Patient not taking: Reported on 08/10/2014 07/11/14   Joni Reining Pisciotta, PA-C  ciprofloxacin  (CIPRO) 500 MG tablet Take 1 tablet (500 mg total) by mouth 2 (two) times daily. Patient not taking: Reported on 09/15/2014 08/10/14   Cathren Laine, MD  ferrous sulfate 325 (65 FE) MG tablet Take 1 tablet (325 mg total) by mouth daily. 09/15/14   Oswaldo Conroy, PA-C  metroNIDAZOLE (FLAGYL) 500 MG tablet Take 1 tablet (500 mg total) by mouth 2 (two) times daily. 09/15/14   Oswaldo Conroy, PA-C  ondansetron (ZOFRAN ODT) 4 MG disintegrating tablet Take 1 tablet (4 mg total) by mouth every 8 (eight) hours as needed for nausea or vomiting. Patient not taking: Reported on 08/10/2014 08/01/14   Elson Areas, PA-C  ondansetron (ZOFRAN) 4 MG tablet Take 1 tablet (4 mg total) by mouth every 6 (six) hours. 09/15/14   Oswaldo Conroy, PA-C  phenazopyridine (PYRIDIUM) 200 MG tablet Take 1 tablet (200 mg total) by mouth 3 (three) times daily. Patient not taking: Reported on 08/10/2014 07/11/14   Joni Reining Pisciotta, PA-C  promethazine (PHENERGAN) 25 MG tablet Take 1 tablet (25 mg total) by mouth every 6 (six) hours as needed for nausea or vomiting. Patient not taking: Reported on 09/15/2014 08/11/14   Nelva Nay, MD  sertraline (ZOLOFT) 50 MG tablet Take 1 tablet (50 mg total) by mouth daily. Patient not taking: Reported on 08/10/2014 03/25/14   Gerhard Munch, MD  traMADol (ULTRAM) 50 MG tablet Take 1 tablet (50 mg total)  by mouth every 6 (six) hours as needed. Patient not taking: Reported on 09/15/2014 08/10/14   Cathren Laine, MD   BP 116/70 mmHg  Pulse 59  Temp(Src) 98.6 F (37 C) (Oral)  Resp 16  Ht  (1.6 m)  Wt 167 lb (75.751 kg)  BMI 29.59 kg/m2  SpO2 100%  LMP 08/08/2014 Physical Exam  Constitutional: She appears well-developed and well-nourished. No distress.  HENT:  Head: Normocephalic and atraumatic.  Mouth/Throat: Oropharynx is clear and moist.  Eyes: Conjunctivae and EOM are normal. Right eye exhibits no discharge. Left eye exhibits no discharge.  Cardiovascular: Normal rate and regular rhythm.    Pulmonary/Chest: Effort normal and breath sounds normal. No respiratory distress. She has no wheezes.  Abdominal: Soft. Bowel sounds are normal. She exhibits no distension.  Mild left lower quadrant pelvic pain without rebound, rigidity, guarding. Right-sided flank tenderness noted on left.  Genitourinary:  External genitalia without erythema, tenderness, lesions. Cervix mildly erythematous and friable without lesions. Os closed. No CMT. Mild left adnexal tenderness. No adnexal masses appreciated. Minimal discharge without foul odor. Nursing tech in room for exam.  Neurological: She is alert. She exhibits normal muscle tone. Coordination normal.  Skin: Skin is warm and dry. She is not diaphoretic.  Nursing note and vitals reviewed.   ED Course  Procedures (including critical care time) Labs Review Labs Reviewed  WET PREP, GENITAL - Abnormal; Notable for the following:    Clue Cells Wet Prep HPF POC MODERATE (*)    WBC, Wet Prep HPF POC FEW (*)    All other components within normal limits  CBC WITH DIFFERENTIAL/PLATELET - Abnormal; Notable for the following:    Hemoglobin 10.2 (*)    HCT 32.3 (*)    MCV 76.2 (*)    MCH 24.1 (*)    All other components within normal limits  URINALYSIS, ROUTINE W REFLEX MICROSCOPIC - Abnormal; Notable for the following:    APPearance HAZY (*)    Ketones, ur 15 (*)    All other components within normal limits  COMPREHENSIVE METABOLIC PANEL  LIPASE, BLOOD  RPR  HIV ANTIBODY (ROUTINE TESTING)  POC URINE PREG, ED  GC/CHLAMYDIA PROBE AMP ([place])    Imaging Review No results found.   EKG Interpretation None      MDM   Final diagnoses:  Nausea  Anemia, unspecified anemia type  Cervicitis  Bacterial vaginosis  Right flank pain   Patient presenting with 2 day history of right flank pain patient concerned with kidney infection because it feels like it did before. VSS. Patient with mild left lower quadrant abdominal tenderness and right  flank pain. No evidence of peritonitis on exam exam. Patient tolerating fluids in the ED. Improvement of her pain with Norco. Pelvic exam with evidence of cervicitis. No CMT and few white cells. I doubt PID. Patient given antibiotics prophylactically in ED. Patient also diagnosed with bacterial vaginosis. She's been instructed not to drink alcohol while taking Flagyl. Patient also with persistent right flank pain. Repeat abdominal exam with nonsurgical abdomen. Laboratory workup reassuring. Discussed the option of CT and patient refusing at this time. She is not pregnant and urine without evidence of infection. Patient also with anemia and has been given iron supplementation. She is nontoxic and stable for outpatient management. [facility]. She is also to follow-up with her primary care provider.  Discussed return precautions with patient. Discussed all results and patient verbalizes understanding  and agrees with plan.    Oswaldo ConroyVictoria Jadee Golebiewski, PA-C 09/15/14 2150  Oswaldo ConroyVictoria Jaysean Manville, PA-C 09/15/14 2150  Rolland PorterMark James, MD 09/24/14 [phone]

## 2014-09-15 NOTE — ED Notes (Signed)
Pt states she was tx here yesterday for R flank pain and was discharged with no prescriptions.  Pt states she is still feeling the pain.  Emesis x 1 this am.  Denies dysuria.  Pain increases when pt bends.

## 2014-09-16 LAB — HIV ANTIBODY (ROUTINE TESTING W REFLEX): HIV SCREEN 4TH GENERATION: NONREACTIVE

## 2014-09-16 LAB — RPR: RPR: NONREACTIVE

## 2014-09-16 LAB — GC/CHLAMYDIA PROBE AMP (~~LOC~~) NOT AT ARMC
Chlamydia: NEGATIVE
Neisseria Gonorrhea: NEGATIVE

## 2014-09-18 ENCOUNTER — Encounter (HOSPITAL_COMMUNITY): Payer: Self-pay

## 2014-09-18 ENCOUNTER — Inpatient Hospital Stay (HOSPITAL_COMMUNITY)
Admission: AD | Admit: 2014-09-18 | Discharge: 2014-09-18 | Disposition: A | Discharge status: Home / Self Care | Payer: Medicaid Other | Source: Ambulatory Visit | Attending: Obstetrics and Gynecology | Admitting: Obstetrics and Gynecology

## 2014-09-18 DIAGNOSIS — G8929 Other chronic pain: Secondary | ICD-10-CM

## 2014-09-18 DIAGNOSIS — R102 Pelvic and perineal pain: Secondary | ICD-10-CM

## 2014-09-18 DIAGNOSIS — R109 Unspecified abdominal pain: Secondary | ICD-10-CM

## 2014-09-18 DIAGNOSIS — N72 Inflammatory disease of cervix uteri: Secondary | ICD-10-CM

## 2014-09-18 DIAGNOSIS — R1032 Left lower quadrant pain: Secondary | ICD-10-CM | POA: Insufficient documentation

## 2014-09-18 DIAGNOSIS — Z87891 Personal history of nicotine dependence: Secondary | ICD-10-CM | POA: Insufficient documentation

## 2014-09-18 LAB — URINALYSIS, ROUTINE W REFLEX MICROSCOPIC
Bilirubin Urine: NEGATIVE
GLUCOSE, UA: NEGATIVE mg/dL
KETONES UR: NEGATIVE mg/dL
Leukocytes, UA: NEGATIVE
Nitrite: NEGATIVE
PH: 6 (ref 5.0–8.0)
Protein, ur: NEGATIVE mg/dL
Specific Gravity, Urine: 1.025 (ref 1.005–1.030)
Urobilinogen, UA: 0.2 mg/dL (ref 0.0–1.0)

## 2014-09-18 LAB — URINE MICROSCOPIC-ADD ON

## 2014-09-18 MED ORDER — DOXYCYCLINE HYCLATE 100 MG PO CAPS: 100.0000 mg | ORAL_CAPSULE | Freq: Two times a day (BID) | ORAL | Status: DC

## 2014-09-18 NOTE — MAU Note (Signed)
Pt presents to MAU with complaints of lower abdominal pain, nausea and states she started her menstrual cycle on Friday but it was only spotting

## 2014-09-18 NOTE — MAU Provider Note (Signed)
History     CSN: [id number]  Arrival date and time: 09/18/14 1537   First Provider Initiated Contact with Patient 09/18/14 1622      Chief Complaint  Patient presents with  . Abdominal Pain  . Nausea   HPI  Sarah Norris is a 19 y.o.G0 LMP 08/08/14. She presents with c/o constant LLQ pain x1 wk- is sharp cramping. She had light bleeding 3/11, spotted yesterday, light bleeding again today. It is bright red, no clots. She has had nausea x 1 wk off/on, vomited x2. UPT's at home have been negative, one partner [id number], condoms occ, neg STD screens already. She also c/o right  Flank pain, no urinary frequency, urgency or  dysuria. ED visits 3/9 and 3/10 for flank pain were neg for pregnancy and UTI's. She was dx with BV, tx with Flagyl that she is taking. She does Zumba 2x/d for exercise.   OB History    No data available      Past Medical History  Diagnosis Date  . TB (pulmonary tuberculosis)     4 months ago 2015  . Strep throat     Past Surgical History  Procedure Laterality Date  . Knee surgery      Family History  Problem Relation Age of Onset  . Hypertension Mother   . Diabetes Mother     History  Substance Use Topics  . Smoking status: Former Games developermoker  . Smokeless tobacco: Never Used  . Alcohol Use: No     Comment: occ    Allergies: No Known Allergies  Prescriptions prior to admission  Medication Sig Dispense Refill Last Dose  . doxylamine, Sleep, (UNISOM) 25 MG tablet Take 25 mg by mouth at bedtime as needed for sleep.   09/17/2014 at Unknown time  . ferrous sulfate 325 (65 FE) MG tablet Take 1 tablet (325 mg total) by mouth daily. 30 tablet 0 09/18/2014 at Unknown time  . ibuprofen (ADVIL,MOTRIN) 200 MG tablet Take 400 mg by mouth every 6 (six) hours as needed for mild pain.    Past Week at Unknown time  . metroNIDAZOLE (FLAGYL) 500 MG tablet Take 1 tablet (500 mg total) by mouth 2 (two) times daily. 14 tablet 0 09/18/2014 at Unknown time  . cephALEXin (KEFLEX) 500  MG capsule Take 1 capsule (500 mg total) by mouth 2 (two) times daily. (Patient not taking: Reported on 08/10/2014) 20 capsule 0 Completed Course at Unknown time  . ciprofloxacin (CIPRO) 500 MG tablet Take 1 tablet (500 mg total) by mouth 2 (two) times daily. (Patient not taking: Reported on 09/15/2014) 14 tablet 0 Completed Course at Unknown time  . ondansetron (ZOFRAN ODT) 4 MG disintegrating tablet Take 1 tablet (4 mg total) by mouth every 8 (eight) hours as needed for nausea or vomiting. (Patient not taking: Reported on 08/10/2014) 20 tablet 0 Not Taking at Unknown time  . ondansetron (ZOFRAN) 4 MG tablet Take 1 tablet (4 mg total) by mouth every 6 (six) hours. (Patient not taking: Reported on 09/18/2014) 12 tablet 0   . phenazopyridine (PYRIDIUM) 200 MG tablet Take 1 tablet (200 mg total) by mouth 3 (three) times daily. (Patient not taking: Reported on 08/10/2014) 6 tablet 0 Not Taking at Unknown time  . promethazine (PHENERGAN) 25 MG tablet Take 1 tablet (25 mg total) by mouth every 6 (six) hours as needed for nausea or vomiting. (Patient not taking: Reported on 09/15/2014) 30 tablet 0 Not Taking at Unknown time  . sertraline (ZOLOFT) 50 MG  tablet Take 1 tablet (50 mg total) by mouth daily. (Patient not taking: Reported on 08/10/2014) 30 tablet 1 Not Taking at Unknown time  . traMADol (ULTRAM) 50 MG tablet Take 1 tablet (50 mg total) by mouth every 6 (six) hours as needed. (Patient not taking: Reported on 09/15/2014) 20 tablet 0 Not Taking at Unknown time    Review of Systems  Constitutional: Negative for fever and chills.  Gastrointestinal: Positive for nausea, vomiting and abdominal pain. Negative for heartburn, diarrhea and constipation.  Genitourinary: Positive for flank pain. Negative for dysuria, urgency and frequency.  Musculoskeletal: Positive for back pain.   Physical Exam   Blood pressure 131/74, pulse 84, temperature 99.2 F (37.3 C), resp. rate 16, last menstrual period  09/16/2014.  Physical Exam  Nursing note and vitals reviewed. Constitutional: She is oriented to person, place, and time. She appears well-developed and well-nourished.  GI: Soft. There is no tenderness (sl tender LLQ). There is no rebound and no guarding.  Genitourinary:  Pelvic exam-  Ext gen- nl anatomy, skin intact Vagina- small amt bright rd blood- menses Cx- closed, sl tender to palpation Uterus- nl size, non tender Adn- no masses palp, non tender  Mild tenderness R lower ribs on side and back- not true CVA tenderness  Musculoskeletal: Normal range of motion.  Neurological: She is alert and oriented to person, place, and time.  Skin: Skin is warm and dry.  Psychiatric: She has a normal mood and affect. Her behavior is normal.    MAU Course  Procedures  MDM Results for orders placed or performed during the hospital encounter of 09/18/14 (from the past 24 hour(s))  Urinalysis, Routine w reflex microscopic     Status: Abnormal   Collection Time: 09/18/14  3:50 PM  Result Value Ref Range   Color, Urine YELLOW YELLOW   APPearance CLEAR CLEAR   Specific Gravity, Urine 1.025 1.005 - 1.030   pH 6.0 5.0 - 8.0   Glucose, UA NEGATIVE NEGATIVE mg/dL   Hgb urine dipstick SMALL (A) NEGATIVE   Bilirubin Urine NEGATIVE NEGATIVE   Ketones, ur NEGATIVE NEGATIVE mg/dL   Protein, ur NEGATIVE NEGATIVE mg/dL   Urobilinogen, UA 0.2 0.0 - 1.0 mg/dL   Nitrite NEGATIVE NEGATIVE   Leukocytes, UA NEGATIVE NEGATIVE  Urine microscopic-add on     Status: Abnormal   Collection Time: 09/18/14  3:50 PM  Result Value Ref Range   Squamous Epithelial / LPF FEW (A) RARE   WBC, UA 0-2 <3 WBC/hpf   RBC / HPF 3-6 <3 RBC/hpf   Bacteria, UA FEW (A) RARE     Assessment and Plan  Persistent right flank pain with neg urinalysis Cervicitis    Doxycycline x 7d Continue with Flagyl till finished Call PCP for continued evaluation  Zarayah Lanting M. 09/18/2014, 4:32 PM

## 2014-09-18 NOTE — MAU Note (Signed)
Ate toast around 1300, water around 1000.

## 2014-10-12 ENCOUNTER — Emergency Department (HOSPITAL_COMMUNITY)
Admission: EM | Admit: 2014-10-12 | Discharge: 2014-10-13 | Disposition: A | Discharge status: Home / Self Care | Payer: Medicaid Other | Attending: Emergency Medicine | Admitting: Emergency Medicine

## 2014-10-12 ENCOUNTER — Encounter (HOSPITAL_COMMUNITY): Payer: Self-pay | Admitting: Emergency Medicine

## 2014-10-12 DIAGNOSIS — Z8611 Personal history of tuberculosis: Secondary | ICD-10-CM | POA: Insufficient documentation

## 2014-10-12 DIAGNOSIS — Z87891 Personal history of nicotine dependence: Secondary | ICD-10-CM | POA: Insufficient documentation

## 2014-10-12 DIAGNOSIS — Z792 Long term (current) use of antibiotics: Secondary | ICD-10-CM | POA: Insufficient documentation

## 2014-10-12 DIAGNOSIS — J020 Streptococcal pharyngitis: Secondary | ICD-10-CM | POA: Insufficient documentation

## 2014-10-12 DIAGNOSIS — Z3202 Encounter for pregnancy test, result negative: Secondary | ICD-10-CM | POA: Insufficient documentation

## 2014-10-12 DIAGNOSIS — Z79899 Other long term (current) drug therapy: Secondary | ICD-10-CM | POA: Insufficient documentation

## 2014-10-12 LAB — CBC WITH DIFFERENTIAL/PLATELET
Basophils Absolute: 0 10*3/uL (ref 0.0–0.1)
Basophils Relative: 0 % (ref 0–1)
Eosinophils Absolute: 0 10*3/uL (ref 0.0–0.7)
Eosinophils Relative: 0 % (ref 0–5)
HCT: 35.3 % — ABNORMAL LOW (ref 36.0–46.0)
Hemoglobin: 11.3 g/dL — ABNORMAL LOW (ref 12.0–15.0)
LYMPHS ABS: 0.5 10*3/uL — AB (ref 0.7–4.0)
LYMPHS PCT: 5 % — AB (ref 12–46)
MCH: 24.1 pg — ABNORMAL LOW (ref 26.0–34.0)
MCHC: 32 g/dL (ref 30.0–36.0)
MCV: 75.4 fL — AB (ref 78.0–100.0)
Monocytes Absolute: 0.9 10*3/uL (ref 0.1–1.0)
Monocytes Relative: 9 % (ref 3–12)
NEUTROS ABS: 8.5 10*3/uL — AB (ref 1.7–7.7)
Neutrophils Relative %: 86 % — ABNORMAL HIGH (ref 43–77)
PLATELETS: 167 10*3/uL (ref 150–400)
RBC: 4.68 MIL/uL (ref 3.87–5.11)
RDW: 14.7 % (ref 11.5–15.5)
WBC: 10 10*3/uL (ref 4.0–10.5)

## 2014-10-12 LAB — COMPREHENSIVE METABOLIC PANEL
ALT: 11 U/L (ref 0–35)
AST: 16 U/L (ref 0–37)
Albumin: 3.6 g/dL (ref 3.5–5.2)
Alkaline Phosphatase: 49 U/L (ref 39–117)
Anion gap: 11 (ref 5–15)
BILIRUBIN TOTAL: 0.7 mg/dL (ref 0.3–1.2)
BUN: 9 mg/dL (ref 6–23)
CHLORIDE: 102 mmol/L (ref 96–112)
CO2: 21 mmol/L (ref 19–32)
Calcium: 8.8 mg/dL (ref 8.4–10.5)
Creatinine, Ser: 0.74 mg/dL (ref 0.50–1.10)
GFR calc non Af Amer: >90 mL/min (ref >90)
GLUCOSE: 97 mg/dL (ref 70–99)
Potassium: 3.4 mmol/L — ABNORMAL LOW (ref 3.5–5.1)
Sodium: 134 mmol/L — ABNORMAL LOW (ref 135–145)
Total Protein: 7.4 g/dL (ref 6.0–8.3)

## 2014-10-12 LAB — RAPID STREP SCREEN (MED CTR MEBANE ONLY): Streptococcus, Group A Screen (Direct): POSITIVE — AB

## 2014-10-12 LAB — URINALYSIS, ROUTINE W REFLEX MICROSCOPIC
GLUCOSE, UA: NEGATIVE mg/dL
Ketones, ur: >80 mg/dL — AB
Leukocytes, UA: NEGATIVE
NITRITE: NEGATIVE
PH: 5 (ref 5.0–8.0)
Protein, ur: NEGATIVE mg/dL
Specific Gravity, Urine: 1.026 (ref 1.005–1.030)
Urobilinogen, UA: 0.2 mg/dL (ref 0.0–1.0)

## 2014-10-12 LAB — URINE MICROSCOPIC-ADD ON

## 2014-10-12 LAB — POC URINE PREG, ED: PREG TEST UR: NEGATIVE

## 2014-10-12 MED ORDER — SODIUM CHLORIDE 0.9 % IV BOLUS (SEPSIS)
1000.0000 mL | Freq: Once | INTRAVENOUS | Status: AC
  Administered 2014-10-12: 1000 mL via INTRAVENOUS

## 2014-10-12 MED ORDER — PENICILLIN V POTASSIUM 250 MG PO TABS
500.0000 mg | ORAL_TABLET | Freq: Once | ORAL | Status: AC
  Administered 2014-10-12: 500 mg via ORAL
  Filled 2014-10-12: qty 2

## 2014-10-12 MED ORDER — IBUPROFEN 800 MG PO TABS: 800.0000 mg | ORAL_TABLET | Freq: Three times a day (TID) | ORAL | Status: DC | PRN

## 2014-10-12 MED ORDER — ACETAMINOPHEN 325 MG PO TABS
650.0000 mg | ORAL_TABLET | Freq: Once | ORAL | Status: AC
  Administered 2014-10-12: 650 mg via ORAL
  Filled 2014-10-12 (×2): qty 2

## 2014-10-12 MED ORDER — PENICILLIN V POTASSIUM 500 MG PO TABS: 500.0000 mg | ORAL_TABLET | Freq: Four times a day (QID) | ORAL | Status: AC

## 2014-10-12 NOTE — ED Notes (Addendum)
Pt. reports generalized body aches with fever , chills and sore throat onset yesterday.

## 2014-10-12 NOTE — Discharge Instructions (Signed)
Drink plenty of fluids.  Take tylenol for fever.  Do not take aspirin for fever.   Drink plenty of fluids.  Follow up if not improving.

## 2014-10-12 NOTE — ED Notes (Signed)
Pt given ice water.

## 2014-10-12 NOTE — ED Provider Notes (Signed)
CSN: [id number]     Arrival date & time 10/12/14  2041 History   First MD Initiated Contact with Patient 10/12/14 2118     Chief Complaint  Patient presents with  . Generalized Body Aches  . Fever     (Consider location/radiation/quality/duration/timing/severity/associated sxs/prior Treatment) Patient is a 19 y.o. female presenting with fever. The history is provided by the patient (the pt complains of a fever and sorethroat).  Fever Temp source:  Oral Severity:  Moderate Onset quality:  Sudden Timing:  Constant Progression:  Worsening Chronicity:  New Relieved by:  Nothing Associated symptoms: no chest pain, no congestion, no cough, no diarrhea, no headaches and no rash     Past Medical History  Diagnosis Date  . TB (pulmonary tuberculosis)     4 months ago 2015  . Strep throat    Past Surgical History  Procedure Laterality Date  . Knee surgery     Family History  Problem Relation Age of Onset  . Hypertension Mother   . Diabetes Mother    History  Substance Use Topics  . Smoking status: Former Games developer  . Smokeless tobacco: Never Used  . Alcohol Use: No     Comment: occ   OB History    No data available     Review of Systems  Constitutional: Positive for fever. Negative for appetite change and fatigue.  HENT: Negative for congestion, ear discharge and sinus pressure.        Sorethroat  Eyes: Negative for discharge.  Respiratory: Negative for cough.   Cardiovascular: Negative for chest pain.  Gastrointestinal: Negative for abdominal pain and diarrhea.  Genitourinary: Negative for frequency and hematuria.  Musculoskeletal: Negative for back pain.  Skin: Negative for rash.  Neurological: Negative for seizures and headaches.  Psychiatric/Behavioral: Negative for hallucinations.      Allergies  Review of patient's allergies indicates no known allergies.  Home Medications   Prior to Admission medications   Medication Sig Start Date End Date Taking?  Authorizing Provider  doxylamine, Sleep, (UNISOM) 25 MG tablet Take 25 mg by mouth at bedtime as needed for sleep.   Yes Historical Provider, MD  ferrous sulfate 325 (65 FE) MG tablet Take 1 tablet (325 mg total) by mouth daily. 09/15/14  Yes Oswaldo Conroy, PA-C  doxycycline (VIBRAMYCIN) 100 MG capsule Take 1 capsule (100 mg total) by mouth 2 (two) times daily. Patient not taking: Reported on 10/12/2014 09/18/14   Rodell Perna, NP  ibuprofen (ADVIL,MOTRIN) 800 MG tablet Take 1 tablet (800 mg total) by mouth every 8 (eight) hours as needed for moderate pain. 10/12/14   Bethann Berkshire, MD  metroNIDAZOLE (FLAGYL) 500 MG tablet Take 1 tablet (500 mg total) by mouth 2 (two) times daily. Patient not taking: Reported on 10/12/2014 09/15/14   Oswaldo Conroy, PA-C  penicillin v potassium (VEETID) 500 MG tablet Take 1 tablet (500 mg total) by mouth 4 (four) times daily. 10/12/14 10/19/14  Bethann Berkshire, MD  traMADol (ULTRAM) 50 MG tablet Take 1 tablet (50 mg total) by mouth every 6 (six) hours as needed. Patient not taking: Reported on 09/15/2014 08/10/14   Cathren Laine, MD   BP 109/46 mmHg  Pulse 100  Temp(Src) 100.6 F (38.1 C) (Oral)  Resp 36  SpO2 98%  LMP 09/16/2014 Physical Exam  Constitutional: She is oriented to person, place, and time. She appears well-developed.  HENT:  Head: Normocephalic.  pharnx inflamed with excudate  Eyes: Conjunctivae and EOM are normal. No scleral icterus.  Neck: Neck supple. No thyromegaly present.  Cardiovascular: Normal rate and regular rhythm.  Exam reveals no gallop and no friction rub.   No murmur heard. Pulmonary/Chest: No stridor. She has no wheezes. She has no rales. She exhibits no tenderness.  Abdominal: She exhibits no distension. There is no tenderness. There is no rebound.  Musculoskeletal: Normal range of motion. She exhibits no edema.  Lymphadenopathy:    She has no cervical adenopathy.  Neurological: She is oriented to person, place, and time. She  exhibits normal muscle tone. Coordination normal.  Skin: No rash noted. No erythema.  Psychiatric: She has a normal mood and affect. Her behavior is normal.    ED Course  Procedures (including critical care time) Labs Review Labs Reviewed  RAPID STREP SCREEN - Abnormal; Notable for the following:    Streptococcus, Group A Screen (Direct) POSITIVE (*)    All other components within normal limits  CBC WITH DIFFERENTIAL/PLATELET - Abnormal; Notable for the following:    Hemoglobin 11.3 (*)    HCT 35.3 (*)    MCV 75.4 (*)    MCH 24.1 (*)    Neutrophils Relative % 86 (*)    Neutro Abs 8.5 (*)    Lymphocytes Relative 5 (*)    Lymphs Abs 0.5 (*)    All other components within normal limits  COMPREHENSIVE METABOLIC PANEL - Abnormal; Notable for the following:    Sodium 134 (*)    Potassium 3.4 (*)    All other components within normal limits  URINALYSIS, ROUTINE W REFLEX MICROSCOPIC - Abnormal; Notable for the following:    APPearance HAZY (*)    Hgb urine dipstick MODERATE (*)    Bilirubin Urine SMALL (*)    Ketones, ur >80 (*)    All other components within normal limits  URINE MICROSCOPIC-ADD ON  POC URINE PREG, ED    Imaging Review No results found.   EKG Interpretation None      MDM   Final diagnoses:  Strep throat    Strep throat,  tx with penicillin    Bethann BerkshireJoseph Oris Calmes, MD 10/12/14 2339

## 2014-11-19 ENCOUNTER — Encounter (HOSPITAL_COMMUNITY): Payer: Self-pay | Admitting: Adult Health

## 2014-11-19 ENCOUNTER — Emergency Department (HOSPITAL_COMMUNITY)
Admission: EM | Admit: 2014-11-19 | Discharge: 2014-11-19 | Discharge status: Against Medical Advice | Payer: Medicaid Other | Attending: Emergency Medicine | Admitting: Emergency Medicine

## 2014-11-19 DIAGNOSIS — R110 Nausea: Secondary | ICD-10-CM | POA: Insufficient documentation

## 2014-11-19 DIAGNOSIS — F419 Anxiety disorder, unspecified: Secondary | ICD-10-CM | POA: Insufficient documentation

## 2014-11-19 DIAGNOSIS — R531 Weakness: Secondary | ICD-10-CM | POA: Insufficient documentation

## 2014-11-19 LAB — URINALYSIS, ROUTINE W REFLEX MICROSCOPIC
BILIRUBIN URINE: NEGATIVE
Glucose, UA: NEGATIVE mg/dL
Ketones, ur: NEGATIVE mg/dL
Leukocytes, UA: NEGATIVE
NITRITE: NEGATIVE
Protein, ur: NEGATIVE mg/dL
Specific Gravity, Urine: 1.024 (ref 1.005–1.030)
UROBILINOGEN UA: 0.2 mg/dL (ref 0.0–1.0)
pH: 6 (ref 5.0–8.0)

## 2014-11-19 LAB — POC URINE PREG, ED: Preg Test, Ur: NEGATIVE

## 2014-11-19 LAB — URINE MICROSCOPIC-ADD ON

## 2014-11-19 NOTE — ED Notes (Signed)
Presents with generalized weakness, nausea and feeling anxious-denies diarrhea, denies vomiting,  denies fevers, denies pain. Endorses frequent urination. Alert, oriented.

## 2014-11-19 NOTE — ED Provider Notes (Signed)
Patient left without being seen after Triage. This provider walked into the room and patient was not there. This provider did not exam or assess the patient - patient left the ED. This provider did not participate in any medical decision making for the patient.   Raymon MuttonMarissa Takyah Ciaramitaro, PA-C 11/19/14 2316  Cathren LaineKevin Steinl, MD 11/22/14 [phone]

## 2014-11-19 NOTE — ED Notes (Addendum)
Left without being seen.  Signed AMA form.  Verbalizes understanding of risks.

## 2014-11-19 NOTE — ED Notes (Signed)
Called into room by patient.  States I just want to go home.  I don't want to be stuck or anything.  MD made aware.  Explained to patient the risk of leaving AMA.  Verbalizes understanding.

## 2015-01-11 ENCOUNTER — Emergency Department (HOSPITAL_COMMUNITY)
Admission: EM | Admit: 2015-01-11 | Discharge: 2015-01-11 | Disposition: A | Discharge status: Home / Self Care | Payer: Medicaid Other | Attending: Emergency Medicine | Admitting: Emergency Medicine

## 2015-01-11 ENCOUNTER — Encounter (HOSPITAL_COMMUNITY): Payer: Self-pay | Admitting: *Deleted

## 2015-01-11 DIAGNOSIS — R519 Headache, unspecified: Secondary | ICD-10-CM

## 2015-01-11 DIAGNOSIS — Z3202 Encounter for pregnancy test, result negative: Secondary | ICD-10-CM | POA: Insufficient documentation

## 2015-01-11 DIAGNOSIS — Z87891 Personal history of nicotine dependence: Secondary | ICD-10-CM | POA: Insufficient documentation

## 2015-01-11 DIAGNOSIS — R51 Headache: Secondary | ICD-10-CM | POA: Insufficient documentation

## 2015-01-11 DIAGNOSIS — Z8709 Personal history of other diseases of the respiratory system: Secondary | ICD-10-CM | POA: Insufficient documentation

## 2015-01-11 DIAGNOSIS — Z8619 Personal history of other infectious and parasitic diseases: Secondary | ICD-10-CM | POA: Insufficient documentation

## 2015-01-11 DIAGNOSIS — G4700 Insomnia, unspecified: Secondary | ICD-10-CM | POA: Insufficient documentation

## 2015-01-11 LAB — PREGNANCY, URINE: Preg Test, Ur: NEGATIVE

## 2015-01-11 LAB — URINALYSIS, ROUTINE W REFLEX MICROSCOPIC
BILIRUBIN URINE: NEGATIVE
GLUCOSE, UA: NEGATIVE mg/dL
HGB URINE DIPSTICK: NEGATIVE
KETONES UR: NEGATIVE mg/dL
Leukocytes, UA: NEGATIVE
Nitrite: NEGATIVE
PH: 7 (ref 5.0–8.0)
Protein, ur: NEGATIVE mg/dL
Specific Gravity, Urine: 1.023 (ref 1.005–1.030)
Urobilinogen, UA: 0.2 mg/dL (ref 0.0–1.0)

## 2015-01-11 MED ORDER — SODIUM CHLORIDE 0.9 % IV SOLN: INTRAVENOUS | Status: DC

## 2015-01-11 MED ORDER — DIPHENHYDRAMINE HCL 50 MG/ML IJ SOLN
25.0000 mg | Freq: Once | INTRAMUSCULAR | Status: DC
  Filled 2015-01-11: qty 1

## 2015-01-11 MED ORDER — DOXYLAMINE SUCCINATE (SLEEP) 25 MG PO TABS: 25.0000 mg | ORAL_TABLET | Freq: Every evening | ORAL | Status: DC | PRN

## 2015-01-11 MED ORDER — KETOROLAC TROMETHAMINE 30 MG/ML IJ SOLN
30.0000 mg | Freq: Once | INTRAMUSCULAR | Status: DC
  Filled 2015-01-11: qty 1

## 2015-01-11 MED ORDER — ONDANSETRON 4 MG PO TBDP
4.0000 mg | ORAL_TABLET | Freq: Once | ORAL | Status: AC
  Administered 2015-01-11: 4 mg via ORAL
  Filled 2015-01-11: qty 1

## 2015-01-11 MED ORDER — KETOROLAC TROMETHAMINE 60 MG/2ML IM SOLN
60.0000 mg | Freq: Once | INTRAMUSCULAR | Status: AC
  Administered 2015-01-11: 60 mg via INTRAMUSCULAR
  Filled 2015-01-11: qty 2

## 2015-01-11 MED ORDER — METOCLOPRAMIDE HCL 5 MG/ML IJ SOLN
10.0000 mg | Freq: Once | INTRAMUSCULAR | Status: DC
  Filled 2015-01-11: qty 2

## 2015-01-11 NOTE — ED Notes (Signed)
Attempted two IV sticks without success, pt wants to try pills

## 2015-01-11 NOTE — ED Notes (Signed)
Pt c/o migraines x2 weeks. States she has tried ibuprofen with no relief. Also complained of insomnia for one year. States she has tried benadryl for sleep with no relief.

## 2015-01-11 NOTE — ED Provider Notes (Signed)
CSN: [id number]     Arrival date & time 01/11/15  1630 History  This chart was scribed for non-physician practitioner, [facility] M. Damian Leavell, NP, working with Benjiman Core, MD, by Sarah Norris ED Scribe. This patient was seen in room TR01C/TR01C and the patient's care was started at 5:45 PM    Chief Complaint  Patient presents with  . Migraine  . Insomnia   Patient is a 19 y.o. female presenting with migraines. The history is provided by the patient. No language interpreter was used.  Migraine This is a new problem. The current episode started more than 1 week ago. The problem has been gradually worsening. Associated symptoms include headaches. The symptoms are relieved by NSAIDs.   HPI Comments: Sarah Norris is a 19 y.o. female who presents to the Emergency Department complaining of a worsening, constant, throbbing HA onset 2 weeks ago. Pt says the pain is located in the temporal areas and goes towards the frontal area of her head. She rates her pain at 5/10 now, 10/10 at its worst, 2 days ago. She took ibuprofen, 2 pills at 10am this morning with some relief. She denies ever having a similar headache. She is on minimum wage and not used to having to struggle like this, which is stressful for her. She has a PCP but has not seen them because she is not currently insured. She denies nausea, vomiting, fever, chills, cough, cold, congestion, and ear pain.  She also c/o insomnia for the past 2 months. She is taking benadryl for it with no relief. She states that at one point she was prescribed UNISOM, but has not used that for a long time. She reports no other medical problems. She has NKDA.   Past Medical History  Diagnosis Date  . TB (pulmonary tuberculosis)     4 months ago 2015  . Strep throat    Past Surgical History  Procedure Laterality Date  . Knee surgery     Family History  Problem Relation Age of Onset  . Hypertension Mother   . Diabetes Mother    History  Substance Use Topics   . Smoking status: Former Games developer  . Smokeless tobacco: Never Used  . Alcohol Use: No     Comment: occ   OB History    No data available     Review of Systems  Constitutional: Negative for fever and chills.  HENT: Negative for congestion and ear pain.   Respiratory: Negative for cough.   Gastrointestinal: Negative for nausea and vomiting.  Neurological: Positive for headaches.  Psychiatric/Behavioral: Positive for sleep disturbance.  All other systems reviewed and are negative.   Allergies  Review of patient's allergies indicates no known allergies.  Home Medications   Prior to Admission medications   Medication Sig Start Date End Date Taking? Authorizing Provider  diphenhydrAMINE (BENADRYL) 25 mg capsule Take 25 mg by mouth every 6 (six) hours as needed for sleep.   Yes Historical Provider, MD  ibuprofen (ADVIL,MOTRIN) 800 MG tablet Take 1 tablet (800 mg total) by mouth every 8 (eight) hours as needed for moderate pain. 10/12/14  Yes Bethann Berkshire, MD  doxylamine, Sleep, (UNISOM) 25 MG tablet Take 1 tablet (25 mg total) by mouth at bedtime as needed for sleep. 01/11/15   Sarah Orlene Och, NP   BP 124/73 mmHg  Pulse 102  Temp(Src) 97.5 F (36.4 C) (Oral)  Resp 18  SpO2 100%  LMP 01/04/2015 Physical Exam  Constitutional: She is oriented to person, place,  and time. She appears well-developed and well-nourished. No distress.  HENT:  Head: Normocephalic and atraumatic.  Right Ear: Tympanic membrane normal.  Left Ear: Tympanic membrane normal.  Nose: Nose normal.  Mouth/Throat: Uvula is midline, oropharynx is clear and moist and mucous membranes are normal.  No frontal or maxillary sinus tenderness  Eyes: Conjunctivae and EOM are normal. Pupils are equal, round, and reactive to light.  Neck: Normal range of motion. Neck supple. No tracheal deviation present.  Cardiovascular: Normal rate, regular rhythm and normal heart sounds.   Pulmonary/Chest: Effort normal and breath sounds  normal. No respiratory distress. She has no wheezes. She has no rales.  Abdominal: Soft. Bowel sounds are normal. She exhibits no mass. There is no tenderness.  Musculoskeletal: Normal range of motion. She exhibits no edema.  Radial and pedal pulses strong, adequate circulation, good touch sensation.  Neurological: She is alert and oriented to person, place, and time. She has normal strength. No cranial nerve deficit or sensory deficit. She displays a negative Romberg sign. Gait normal.  Reflex Scores:      Bicep reflexes are 2+ on the right side and 2+ on the left side.      Brachioradialis reflexes are 2+ on the right side and 2+ on the left side.      Patellar reflexes are 2+ on the right side and 2+ on the left side.      Achilles reflexes are 2+ on the right side and 2+ on the left side. Rapid alternating movement without difficulty. Stands on one foot without difficulty.  Skin: Skin is warm and dry.  Psychiatric: She has a normal mood and affect. Her behavior is normal.  Nursing note and vitals reviewed.   ED Course  Procedures  DIAGNOSTIC STUDIES: Oxygen Saturation is 100% on RA, normal by my interpretation.    COORDINATION OF CARE: 5:55 PM - Discussed plans to order migraine medication. [facility] on insomnia. Pt advised of plan for treatment and pt agrees.  7:23 PM - Pt states that she is feeling much better after treatment and rates her pain at 2/10.  MDM  19 y.o. female with temporal headache that improved with treatment of PO medication. Stable for d/c without focal neuro deficits. Discussed with the patient and all questioned fully answered. She will return if any problems arise.  Final diagnoses:  Temporal headache   I personally performed the services described in this documentation, which was scribed in my presence. The recorded information has been reviewed and is accurate.    [address]Sarah GauseM Neese, [place]NP 01/12/15  2225  Benjiman CoreNathan Pickering, MD 01/14/15 [phone]

## 2015-01-11 NOTE — ED Notes (Signed)
Pt verbalizes understanding of d/c instructions and denies any further needs at this time. 

## 2015-02-11 ENCOUNTER — Emergency Department (HOSPITAL_COMMUNITY)
Admission: EM | Admit: 2015-02-11 | Discharge: 2015-02-11 | Disposition: A | Discharge status: Home / Self Care | Payer: Medicaid Other | Attending: Emergency Medicine | Admitting: Emergency Medicine

## 2015-02-11 ENCOUNTER — Encounter (HOSPITAL_COMMUNITY): Payer: Self-pay

## 2015-02-11 DIAGNOSIS — Z8611 Personal history of tuberculosis: Secondary | ICD-10-CM | POA: Insufficient documentation

## 2015-02-11 DIAGNOSIS — N760 Acute vaginitis: Secondary | ICD-10-CM | POA: Insufficient documentation

## 2015-02-11 DIAGNOSIS — B9689 Other specified bacterial agents as the cause of diseases classified elsewhere: Secondary | ICD-10-CM

## 2015-02-11 DIAGNOSIS — Z3490 Encounter for supervision of normal pregnancy, unspecified, unspecified trimester: Secondary | ICD-10-CM

## 2015-02-11 DIAGNOSIS — N390 Urinary tract infection, site not specified: Secondary | ICD-10-CM | POA: Insufficient documentation

## 2015-02-11 DIAGNOSIS — Z87891 Personal history of nicotine dependence: Secondary | ICD-10-CM | POA: Insufficient documentation

## 2015-02-11 DIAGNOSIS — Z331 Pregnant state, incidental: Secondary | ICD-10-CM | POA: Insufficient documentation

## 2015-02-11 DIAGNOSIS — Z79899 Other long term (current) drug therapy: Secondary | ICD-10-CM | POA: Insufficient documentation

## 2015-02-11 LAB — CBC
HCT: 36.2 % (ref 36.0–46.0)
Hemoglobin: 11.4 g/dL — ABNORMAL LOW (ref 12.0–15.0)
MCH: 24.6 pg — ABNORMAL LOW (ref 26.0–34.0)
MCHC: 31.5 g/dL (ref 30.0–36.0)
MCV: 78.2 fL (ref 78.0–100.0)
PLATELETS: 208 10*3/uL (ref 150–400)
RBC: 4.63 MIL/uL (ref 3.87–5.11)
RDW: 13.3 % (ref 11.5–15.5)
WBC: 4.3 10*3/uL (ref 4.0–10.5)

## 2015-02-11 LAB — URINALYSIS, ROUTINE W REFLEX MICROSCOPIC
Bilirubin Urine: NEGATIVE
GLUCOSE, UA: NEGATIVE mg/dL
Ketones, ur: NEGATIVE mg/dL
Nitrite: POSITIVE — AB
PH: 5.5 (ref 5.0–8.0)
SPECIFIC GRAVITY, URINE: 1.026 (ref 1.005–1.030)
Urobilinogen, UA: 0.2 mg/dL (ref 0.0–1.0)

## 2015-02-11 LAB — URINE MICROSCOPIC-ADD ON

## 2015-02-11 LAB — COMPREHENSIVE METABOLIC PANEL
ALK PHOS: 52 U/L (ref 38–126)
ALT: 10 U/L — AB (ref 14–54)
AST: 19 U/L (ref 15–41)
Albumin: 3.9 g/dL (ref 3.5–5.0)
Anion gap: 9 (ref 5–15)
BUN: 11 mg/dL (ref 6–20)
CALCIUM: 9.1 mg/dL (ref 8.9–10.3)
CO2: 25 mmol/L (ref 22–32)
Chloride: 99 mmol/L — ABNORMAL LOW (ref 101–111)
Creatinine, Ser: 0.61 mg/dL (ref 0.44–1.00)
GFR calc Af Amer: >60 mL/min (ref >60)
Glucose, Bld: 86 mg/dL (ref 65–99)
POTASSIUM: 3.5 mmol/L (ref 3.5–5.1)
SODIUM: 133 mmol/L — AB (ref 135–145)
Total Bilirubin: 0.4 mg/dL (ref 0.3–1.2)
Total Protein: 7.6 g/dL (ref 6.5–8.1)

## 2015-02-11 LAB — WET PREP, GENITAL
TRICH WET PREP: NONE SEEN
Yeast Wet Prep HPF POC: NONE SEEN

## 2015-02-11 LAB — I-STAT BETA HCG BLOOD, ED (MC, WL, AP ONLY): I-stat hCG, quantitative: 8.3 m[IU]/mL — ABNORMAL HIGH (ref <5)

## 2015-02-11 LAB — LIPASE, BLOOD: Lipase: 29 U/L (ref 22–51)

## 2015-02-11 MED ORDER — PHENAZOPYRIDINE HCL 100 MG PO TABS
95.0000 mg | ORAL_TABLET | Freq: Once | ORAL | Status: AC
  Administered 2015-02-11: 100 mg via ORAL
  Filled 2015-02-11: qty 1

## 2015-02-11 MED ORDER — METRONIDAZOLE 1 % EX GEL: Freq: Every day | CUTANEOUS | Status: DC

## 2015-02-11 MED ORDER — CEPHALEXIN 500 MG PO CAPS: 500.0000 mg | ORAL_CAPSULE | Freq: Four times a day (QID) | ORAL | Status: DC

## 2015-02-11 MED ORDER — NITROFURANTOIN MONOHYD MACRO 100 MG PO CAPS
100.0000 mg | ORAL_CAPSULE | Freq: Once | ORAL | Status: AC
  Administered 2015-02-11: 100 mg via ORAL
  Filled 2015-02-11: qty 1

## 2015-02-11 NOTE — ED Notes (Signed)
Pt reports onset 2 days ago blood in urine, dysuria and white particles in urine and lower abd pain.

## 2015-02-11 NOTE — Discharge Instructions (Signed)
Bacterial Vaginosis Follow-up with women's outpatient clinic. You will need repeat hCG level. Take anabiotic says prescribed for both BV and urinary tract infection. Return for abdominal pain. Take prenatal vitamins. Bacterial vaginosis is an infection of the vagina. It happens when too many of certain germs (bacteria) grow in the vagina. HOME CARE  Take your medicine as told by your doctor.  Finish your medicine even if you start to feel better.  Do not have sex until you finish your medicine and are better.  Tell your sex partner that you have an infection. They should see their doctor for treatment.  Practice safe sex. Use condoms. Have only one sex partner. GET HELP IF:  You are not getting better after 3 days of treatment.  You have more grey fluid (discharge) coming from your vagina than before.  You have more pain than before.  You have a fever. MAKE SURE YOU:   Understand these instructions.  Will watch your condition.  Will get help right away if you are not doing well or get worse. Document Released: 04/02/2008 Document Revised: 04/14/2013 Document Reviewed: 02/03/2013 [facility] Patient Information 2015 [place], [place]. This information is not intended to replace advice given to you by your health care provider. Make sure you discuss any questions you have with your health care provider.

## 2015-02-11 NOTE — ED Provider Notes (Signed)
CSN: [id number]     Arrival date & time 02/11/15  2123 History  This chart was scribed for Catha Gosselin, PA-C, working with Elwin Mocha, MD by Elon Spanner, ED Scribe. This patient was seen in room TR07C/TR07C and the patient's care was started at 10:30 PM.   Chief Complaint  Patient presents with  . Dysuria  . Hematuria   The history is provided by the patient. No language interpreter was used.   HPI Comments: Sarah Norris is a 19 y.o. female who presents to the Emergency Department complaining of hematuria onset 2 days ago.  She reports associated white, non-odorous vaginal discharge, vaginal spotting, dysuria, and suprapubic abdominal pressure.  Patient took benadryl and ibuprofen the day of onset but has taken no medications since.  Patient is not on birth control and is sexually active. She denies fever, nausea, vomiting.  LNMP 01/27/15.   Past Medical History  Diagnosis Date  . TB (pulmonary tuberculosis)     4 months ago 2015  . Strep throat    Past Surgical History  Procedure Laterality Date  . Knee surgery     Family History  Problem Relation Age of Onset  . Hypertension Mother   . Diabetes Mother    History  Substance Use Topics  . Smoking status: Former Games developer  . Smokeless tobacco: Never Used  . Alcohol Use: Yes     Comment: occ   OB History    No data available     Review of Systems  Gastrointestinal: Positive for abdominal pain. Negative for nausea and vomiting.  Genitourinary: Positive for dysuria, hematuria, vaginal bleeding and vaginal discharge.  All other systems reviewed and are negative.     Allergies  Review of patient's allergies indicates no known allergies.  Home Medications   Prior to Admission medications   Medication Sig Start Date End Date Taking? Authorizing Provider  cephALEXin (KEFLEX) 500 MG capsule Take 1 capsule (500 mg total) by mouth 4 (four) times daily. 02/11/15   Craig Wisnewski Patel-Mills, PA-C  diphenhydrAMINE (BENADRYL) 25 mg  capsule Take 25 mg by mouth every 6 (six) hours as needed for sleep.    Historical Provider, MD  doxylamine, Sleep, (UNISOM) 25 MG tablet Take 1 tablet (25 mg total) by mouth at bedtime as needed for sleep. 01/11/15   Hope Orlene Och, NP  ibuprofen (ADVIL,MOTRIN) 800 MG tablet Take 1 tablet (800 mg total) by mouth every 8 (eight) hours as needed for moderate pain. 10/12/14   Bethann Berkshire, MD  metroNIDAZOLE (METROGEL) 1 % gel Apply topically daily. 02/11/15   Coran Dipaola Patel-Mills, PA-C   BP 128/62 mmHg  Pulse 88  Temp(Src) 98.6 F (37 C) (Oral)  Resp 18  Ht 5\' 3"  (1.6 m)  Wt 181 lb 1.6 oz (82.146 kg)  BMI 32.09 kg/m2  SpO2 99%  LMP 01/27/2015 Physical Exam  Constitutional: She is oriented to person, place, and time. She appears well-developed and well-nourished. No distress.  HENT:  Head: Normocephalic and atraumatic.  Eyes: Conjunctivae and EOM are normal.  Neck: Neck supple. No tracheal deviation present.  Cardiovascular: Normal rate.   Pulmonary/Chest: Effort normal. No respiratory distress.  Abdominal: Soft. Normal appearance. She exhibits no mass. There is tenderness in the suprapubic area. There is no rigidity, no rebound, no guarding and no CVA tenderness.  Mild suprapubic tenderness. No guarding or rebound.  Genitourinary: No erythema, tenderness or bleeding in the vagina. No foreign body around the vagina. Vaginal discharge found.  Pelvic exam. Chaperone present:  Moderate amount of thick white vaginal discharge. No vaginal leading. Cervical os closed. No adnexal tenderness.  Musculoskeletal: Normal range of motion.  Neurological: She is alert and oriented to person, place, and time.  Skin: Skin is warm and dry.  Psychiatric: She has a normal mood and affect. Her behavior is normal.  Nursing note and vitals reviewed.   ED Course  Procedures (including critical care time)  DIAGNOSTIC STUDIES: Oxygen Saturation is 100% on RA, normal by my interpretation.    COORDINATION OF  CARE:  10:35 PM Discussed treatment plan with patient at bedside.  Patient acknowledges and agrees with plan.    Labs Review Labs Reviewed  WET PREP, GENITAL - Abnormal; Notable for the following:    Clue Cells Wet Prep HPF POC MANY (*)    WBC, Wet Prep HPF POC FEW (*)    All other components within normal limits  URINALYSIS, ROUTINE W REFLEX MICROSCOPIC (NOT AT [facility]) - Abnormal; Notable for the following:    Color, Urine AMBER (*)    APPearance TURBID (*)    Hgb urine dipstick LARGE (*)    Protein, ur >300 (*)    Nitrite POSITIVE (*)    Leukocytes, UA MODERATE (*)    All other components within normal limits  COMPREHENSIVE METABOLIC PANEL - Abnormal; Notable for the following:    Sodium 133 (*)    Chloride 99 (*)    ALT 10 (*)    All other components within normal limits  CBC - Abnormal; Notable for the following:    Hemoglobin 11.4 (*)    MCH 24.6 (*)    All other components within normal limits  URINE MICROSCOPIC-ADD ON - Abnormal; Notable for the following:    Bacteria, UA MANY (*)    All other components within normal limits  I-STAT BETA HCG BLOOD, ED ([facility], [facility], [facility] ONLY) - Abnormal; Notable for the following:    I-stat hCG, quantitative 8.3 (*)    All other components within normal limits  URINE CULTURE  LIPASE, BLOOD  GC/CHLAMYDIA PROBE AMP ([place]) NOT AT [facility]    Imaging Review No results found.   EKG Interpretation None      MDM   Final diagnoses:  Pregnant  Bacterial vaginosis  Urinary tract infection, acute   Patient presents for abdominal pressure, dysuria, hematuria. I do not believe her symptoms are related to an ectopic pregnancy or ovarian torsion at this time. Her vitals are stable. Labs reveal that she has a positive hCG at 8.3 which is consistent with her last period and recent sexual activity. She also has a UTI and BV. I put the patient on Keflex and metronidazole gel. I gave her follow-up with women's outpatient clinic. I also discussed  taking prenatal vitamins. I gave her strict return precautions and discussed the need for repeat hCG. Patient verbally agrees with the plan. Medications  phenazopyridine (PYRIDIUM) tablet 100 mg (100 mg Oral Given 02/11/15 2311)  nitrofurantoin (macrocrystal-monohydrate) (MACROBID) capsule 100 mg (100 mg Oral Given 02/11/15 2311)   I personally performed the services described in this documentation, which was scribed in my presence. The recorded information has been reviewed and is accurate. GC/chlamydia results pending.    Catha Gosselin, PA-C 02/11/15 2327  Elwin Mocha, MD 02/12/15 0000

## 2015-02-13 LAB — GC/CHLAMYDIA PROBE AMP (~~LOC~~) NOT AT ARMC
Chlamydia: NEGATIVE
Neisseria Gonorrhea: NEGATIVE

## 2015-02-14 ENCOUNTER — Inpatient Hospital Stay (HOSPITAL_COMMUNITY)
Admission: AD | Admit: 2015-02-14 | Discharge: 2015-02-14 | Disposition: A | Discharge status: Home / Self Care | Payer: Medicaid Other | Source: Ambulatory Visit | Attending: Obstetrics & Gynecology | Admitting: Obstetrics & Gynecology

## 2015-02-14 ENCOUNTER — Encounter (HOSPITAL_COMMUNITY): Payer: Self-pay | Admitting: *Deleted

## 2015-02-14 DIAGNOSIS — O0281 Inappropriate change in quantitative human chorionic gonadotropin (hCG) in early pregnancy: Secondary | ICD-10-CM | POA: Insufficient documentation

## 2015-02-14 LAB — URINE CULTURE: Special Requests: NORMAL

## 2015-02-14 LAB — ABO/RH: ABO/RH(D): B POS

## 2015-02-14 LAB — HCG, QUANTITATIVE, PREGNANCY: hCG, Beta Chain, Quant, S: <1 m[IU]/mL (ref <5)

## 2015-02-14 NOTE — MAU Provider Note (Signed)
Subjective:  Sarah Norris is a 19 y.o. female G1P0 at [redacted]w[redacted]d presenting to MAU for a follow up beta hcg level.  She was seen 3 days ago at [facility] for a UTI and was told she was pregnant; her Jamesetta Geralds was 8. She was instructed to come to MAU in 3 days for follow up beta hcg level.   She had a normal menstrual cycle on 7/22; the period was normal in length and flow. She has had unproctive sex in the last few months.   No vaginal bleeding currently; noticed scant amount for a few days only. She denies pain.   Objective:  GENERAL: Well-developed, well-nourished female in no acute distress.  LUNGS: Effort normal SKIN: Warm, dry and without erythema PSYCH: Normal mood and affect  Filed Vitals:   02/14/15 1539  BP: 124/68  Pulse: 88  Temp: 98.2 F (36.8 C)  TempSrc: Oral  Resp: 18  Height:  (1.575 m)  Weight: 83.099 kg (183 lb 3.2 oz)    MDM:  Results for orders placed or performed during the hospital encounter of 02/14/15 (from the past 48 hour(s))  hCG, quantitative, pregnancy     Status: None   Collection Time: 02/14/15  3:50 PM  Result Value Ref Range   hCG, Beta Chain, Quant, S <1 <5 mIU/mL    Comment:          GEST. AGE      CONC.  (mIU/mL)   <=1 WEEK        5 - 50     2 WEEKS       50 - 500     3 WEEKS       100 - 10,000     4 WEEKS     1,000 - 30,000     5 WEEKS     3,500 - 115,000   6-8 WEEKS     12,000 - 270,000    12 WEEKS     15,000 - 220,000        FEMALE AND NON-PREGNANT FEMALE:     LESS THAN 5 mIU/mL REPEATED TO VERIFY   ABO/Rh     Status: None (Preliminary result)   Collection Time: 02/14/15  5:33 PM  Result Value Ref Range   ABO/RH(D) B POS     ABO pending.    Assessment:  1. Chemical pregnancy     Plan:  Discharge home in stable condition Support given Return to MAU if symptoms worsen  Duane Lope, NP 02/14/2015 5:48 PM

## 2015-02-14 NOTE — MAU Note (Signed)
Was seen at [facility] on 8/6, dx with UTI, BV; is taking meds as instructed.  Also was found to be early preg, instructed to follow up here in 3 days.

## 2015-02-15 ENCOUNTER — Telehealth (HOSPITAL_COMMUNITY): Payer: Self-pay

## 2015-02-15 NOTE — Telephone Encounter (Signed)
Post ED Visit - Positive Culture Follow-up  Culture report reviewed by antimicrobial stewardship pharmacist:  Wes Dulaney, Pharm.D., BCPS  Celedonio Miyamoto, [address].D., BCPS  Georgina Pillion, Pharm.D., BCPS  [place], [place].D., BCPS, AAHIVP  Estella Husk, Pharm.D., BCPS, AAHIVP  Elder Cyphers, [address].D., BCPS X  Stone,T. Pharm.  Positive Urine culture, >/= 100,000 colonies -> E Coli Treated with Cephalexin, organism sensitive to the same and no further patient follow-up is required at this time.  Arvid Right 02/15/2015, 10:41 AM

## 2015-04-05 ENCOUNTER — Emergency Department (HOSPITAL_COMMUNITY): Payer: Medicaid Other

## 2015-04-05 ENCOUNTER — Encounter (HOSPITAL_COMMUNITY): Payer: Self-pay | Admitting: General Practice

## 2015-04-05 ENCOUNTER — Emergency Department (HOSPITAL_COMMUNITY)
Admission: EM | Admit: 2015-04-05 | Discharge: 2015-04-05 | Disposition: A | Discharge status: Home / Self Care | Payer: Medicaid Other | Attending: Emergency Medicine | Admitting: Emergency Medicine

## 2015-04-05 DIAGNOSIS — Z87891 Personal history of nicotine dependence: Secondary | ICD-10-CM | POA: Insufficient documentation

## 2015-04-05 DIAGNOSIS — Z79899 Other long term (current) drug therapy: Secondary | ICD-10-CM | POA: Insufficient documentation

## 2015-04-05 DIAGNOSIS — R251 Tremor, unspecified: Secondary | ICD-10-CM | POA: Insufficient documentation

## 2015-04-05 DIAGNOSIS — M25572 Pain in left ankle and joints of left foot: Secondary | ICD-10-CM | POA: Insufficient documentation

## 2015-04-05 DIAGNOSIS — Z3202 Encounter for pregnancy test, result negative: Secondary | ICD-10-CM | POA: Insufficient documentation

## 2015-04-05 DIAGNOSIS — Z8611 Personal history of tuberculosis: Secondary | ICD-10-CM | POA: Insufficient documentation

## 2015-04-05 LAB — CBC WITH DIFFERENTIAL/PLATELET
BASOS ABS: 0 10*3/uL (ref 0.0–0.1)
Basophils Relative: 1 %
EOS ABS: 0.1 10*3/uL (ref 0.0–0.7)
EOS PCT: 3 %
HCT: 34.2 % — ABNORMAL LOW (ref 36.0–46.0)
HEMOGLOBIN: 10.7 g/dL — AB (ref 12.0–15.0)
LYMPHS PCT: 34 %
Lymphs Abs: 1 10*3/uL (ref 0.7–4.0)
MCH: 25.5 pg — AB (ref 26.0–34.0)
MCHC: 31.3 g/dL (ref 30.0–36.0)
MCV: 81.4 fL (ref 78.0–100.0)
Monocytes Absolute: 0.3 10*3/uL (ref 0.1–1.0)
Monocytes Relative: 10 %
Neutro Abs: 1.5 10*3/uL — ABNORMAL LOW (ref 1.7–7.7)
Neutrophils Relative %: 53 %
PLATELETS: 170 10*3/uL (ref 150–400)
RBC: 4.2 MIL/uL (ref 3.87–5.11)
RDW: 13.7 % (ref 11.5–15.5)
WBC: 2.8 10*3/uL — AB (ref 4.0–10.5)

## 2015-04-05 LAB — I-STAT BETA HCG BLOOD, ED (MC, WL, AP ONLY): I-stat hCG, quantitative: <5 m[IU]/mL (ref <5)

## 2015-04-05 LAB — URINE MICROSCOPIC-ADD ON

## 2015-04-05 LAB — BASIC METABOLIC PANEL
Anion gap: 7 (ref 5–15)
BUN: 11 mg/dL (ref 6–20)
CHLORIDE: 105 mmol/L (ref 101–111)
CO2: 25 mmol/L (ref 22–32)
CREATININE: 0.63 mg/dL (ref 0.44–1.00)
Calcium: 8.8 mg/dL — ABNORMAL LOW (ref 8.9–10.3)
GFR calc Af Amer: >60 mL/min (ref >60)
Glucose, Bld: 89 mg/dL (ref 65–99)
POTASSIUM: 4 mmol/L (ref 3.5–5.1)
SODIUM: 137 mmol/L (ref 135–145)

## 2015-04-05 LAB — URINALYSIS, ROUTINE W REFLEX MICROSCOPIC
BILIRUBIN URINE: NEGATIVE
GLUCOSE, UA: NEGATIVE mg/dL
Hgb urine dipstick: NEGATIVE
Ketones, ur: NEGATIVE mg/dL
Leukocytes, UA: NEGATIVE
Nitrite: NEGATIVE
PH: 8 (ref 5.0–8.0)
Protein, ur: 30 mg/dL — AB
SPECIFIC GRAVITY, URINE: 1.02 (ref 1.005–1.030)
UROBILINOGEN UA: 1 mg/dL (ref 0.0–1.0)

## 2015-04-05 LAB — RAPID URINE DRUG SCREEN, HOSP PERFORMED
Amphetamines: NOT DETECTED
BENZODIAZEPINES: NOT DETECTED
Barbiturates: NOT DETECTED
COCAINE: NOT DETECTED
OPIATES: NOT DETECTED
TETRAHYDROCANNABINOL: NOT DETECTED

## 2015-04-05 LAB — CBG MONITORING, ED: Glucose-Capillary: 79 mg/dL (ref 65–99)

## 2015-04-05 LAB — MAGNESIUM: Magnesium: 1.7 mg/dL (ref 1.7–2.4)

## 2015-04-05 MED ORDER — ACETAMINOPHEN 500 MG PO TABS: 500.0000 mg | ORAL_TABLET | Freq: Four times a day (QID) | ORAL | Status: AC | PRN

## 2015-04-05 MED ORDER — ONDANSETRON 4 MG PO TBDP
4.0000 mg | ORAL_TABLET | Freq: Once | ORAL | Status: AC | PRN
  Administered 2015-04-05: 4 mg via ORAL
  Filled 2015-04-05: qty 1

## 2015-04-05 MED ORDER — SODIUM CHLORIDE 0.9 % IV BOLUS (SEPSIS)
1000.0000 mL | Freq: Once | INTRAVENOUS | Status: AC
  Administered 2015-04-05: 1000 mL via INTRAVENOUS

## 2015-04-05 MED ORDER — ACETAMINOPHEN 325 MG PO TABS
650.0000 mg | ORAL_TABLET | Freq: Once | ORAL | Status: AC
  Administered 2015-04-05: 650 mg via ORAL
  Filled 2015-04-05: qty 2

## 2015-04-05 NOTE — ED Notes (Signed)
Pt [id number] 

## 2015-04-05 NOTE — ED Notes (Signed)
Pt was brought in via [facility]. Pt was home with boyfriend, pt got up to use the restroom, pt remembers falling, and waking back up in pain. Pts boyfriend states "she was shaking all over". Urinary incontinence noted, no mouth trauma noted. Pt has no history of seizures. pts boyfriend is unable to recall the duration of seizure. Pt complaining of left ankle pain  5/10. [facility] arrived on scene prior to EMS, and noticed pt did not have an postictal period.

## 2015-04-05 NOTE — ED Notes (Signed)
Pt in radiology during hourly round  

## 2015-04-05 NOTE — Discharge Instructions (Signed)
Tremor Tremor is a rhythmic, involuntary muscular contraction characterized by oscillations (to-and-fro movements) of a part of the body. The most common of all involuntary movements, tremor can affect various body parts such as the hands, head, facial structures, vocal cords, trunk, and legs; most tremors, however, occur in the hands. Tremor often accompanies neurological disorders associated with aging. Although the disorder is not life-threatening, it can be responsible for functional disability and social embarrassment. TREATMENT  There are many types of tremor and several ways in which tremor is classified. The most common classification is by behavioral context or position. There are five categories of tremor within this classification: resting, postural, kinetic, task-specific, and psychogenic. Resting or static tremor occurs when the muscle is at rest, for example when the hands are lying on the lap. This type of tremor is often seen in patients with Parkinson's disease. Postural tremor occurs when a patient attempts to maintain posture, such as holding the hands outstretched. Postural tremors include physiological tremor, essential tremor, tremor with basal ganglia disease (also seen in patients with Parkinson's disease), cerebellar postural tremor, tremor with peripheral neuropathy, post-traumatic tremor, and alcoholic tremor. Kinetic or intention (action) tremor occurs during purposeful movement, for example during finger-to-nose testing. Task-specific tremor appears when performing goal-oriented tasks such as handwriting, speaking, or standing. This group consists of primary writing tremor, vocal tremor, and orthostatic tremor. Psychogenic tremor occurs in both older and younger patients. The key feature of this tremor is that it dramatically lessens or disappears when the patient is distracted. PROGNOSIS There are some treatment options available for tremor; the appropriate treatment depends on  accurate diagnosis of the cause. Some tremors respond to treatment of the underlying condition, for example in some cases of psychogenic tremor treating the patient's underlying mental problem may cause the tremor to disappear. Also, patients with tremor due to Parkinson's disease may be treated with Levodopa drug therapy. Symptomatic drug therapy is available for several other tremors as well. For those cases of tremor in which there is no effective drug treatment, physical measures such as teaching the patient to brace the affected limb during the tremor are sometimes useful. Surgical intervention such as thalamotomy or deep brain stimulation may be useful in certain cases. Document Released: 06/14/2002 Document Revised: 09/16/2011 Document Reviewed: 06/24/2005 [facility] Patient Information 2015 [place], [place]. This information is not intended to replace advice given to you by your health care provider. Make sure you discuss any questions you have with your health care provider.   Ankle Pain Ankle pain is a common symptom. The bones, cartilage, tendons, and muscles of the ankle joint perform a lot of work each day. The ankle joint holds your body weight and allows you to move around. Ankle pain can occur on either side or back of 1 or both ankles. Ankle pain may be sharp and burning or dull and aching. There may be tenderness, stiffness, redness, or warmth around the ankle. The pain occurs more often when a person walks or puts pressure on the ankle. CAUSES  There are many reasons ankle pain can develop. It is important to work with your caregiver to identify the cause since many conditions can impact the bones, cartilage, muscles, and tendons. Causes for ankle pain include:  Injury, including a break (fracture), sprain, or strain often due to a fall, sports, or a high-impact activity.  Swelling (inflammation) of a tendon (tendonitis).  Achilles tendon rupture.  Ankle instability after repeated  sprains and strains.  Poor foot alignment.  Pressure on a nerve (tarsal tunnel syndrome).  Arthritis in the ankle or the lining of the ankle.  Crystal formation in the ankle (gout or pseudogout). DIAGNOSIS  A diagnosis is based on your medical history, your symptoms, results of your physical exam, and results of diagnostic tests. Diagnostic tests may include X-ray exams or a computerized magnetic scan (magnetic resonance imaging, MRI). TREATMENT  Treatment will depend on the cause of your ankle pain and may include:  Keeping pressure off the ankle and limiting activities.  Using crutches or other walking support (a cane or brace).  Using rest, ice, compression, and elevation.  Participating in physical therapy or home exercises.  Wearing shoe inserts or special shoes.  Losing weight.  Taking medications to reduce pain or swelling or receiving an injection.  Undergoing surgery. HOME CARE INSTRUCTIONS   Only take over-the-counter or prescription medicines for pain, discomfort, or fever as directed by your caregiver.  Put ice on the injured area.  Put ice in a plastic bag.  Place a towel between your skin and the bag.  Leave the ice on for 15-20 minutes at a time, 03-04 times a day.  Keep your leg raised (elevated) when possible to lessen swelling.  Avoid activities that cause ankle pain.  Follow specific exercises as directed by your caregiver.  Record how often you have ankle pain, the location of the pain, and what it feels like. This information may be helpful to you and your caregiver.  Ask your caregiver about returning to work or sports and whether you should drive.  Follow up with your caregiver for further examination, therapy, or testing as directed. SEEK MEDICAL CARE IF:   Pain or swelling continues or worsens beyond 1 week.  You have an oral temperature above 102 F (38.9 C).  You are feeling unwell or have chills.  You are having an increasingly  difficult time with walking.  You have loss of sensation or other new symptoms.  You have questions or concerns. MAKE SURE YOU:   Understand these instructions.  Will watch your condition.  Will get help right away if you are not doing well or get worse. Document Released: 12/12/2009 Document Revised: 09/16/2011 Document Reviewed: 12/12/2009 [facility] Patient Information 2015 [place], [place]. This information is not intended to replace advice given to you by your health care provider. Make sure you discuss any questions you have with your health care provider.

## 2015-04-05 NOTE — ED Provider Notes (Signed)
CSN: [id number]     Arrival date & time 04/05/15  1025 History   First MD Initiated Contact with Patient 04/05/15 1026     Chief Complaint  Patient presents with  . Seizures     (Consider location/radiation/quality/duration/timing/severity/associated sxs/prior Treatment) HPI  Sarah Norris is a 19 y.o. female who presents today with concern for possible seizure. Patient states she got up this morning to use the restroom and suddenly fell, she vaguely remembers shaking. He is unsure if she lost consciousness. Her boyfriend states that she was shaking all over and possibly lost control of her bladder. Total time shaking activity is unknown. She denies any prodromal symptoms.  Endorses delayed reaction time for the past couple of days and headache. She does not appear to be post-ictal at this time during the interview. Denies fever, chills, nausea, vomiting, photophobia, visual disturbances, or gait disturbances. Denies recent illness, recent med changes, or trauma. Denies drug use, and drinks alcohol once a week. She also states that she did roll her ankle as well.   LMP is unknown, she states that it's in her phone. Her phone is not with her at this time.  She states that she was not pregnant in the past, and that it was a false positive.   Past Medical History  Diagnosis Date  . TB (pulmonary tuberculosis)     4 months ago 2015  . Strep throat    Past Surgical History  Procedure Laterality Date  . Knee surgery     Family History  Problem Relation Age of Onset  . Hypertension Mother   . Diabetes Mother    Social History  Substance Use Topics  . Smoking status: Former Games developer  . Smokeless tobacco: Never Used  . Alcohol Use: 3.6 oz/week    6 Shots of liquor per week     Comment: occ   OB History    Gravida Para Term Preterm AB TAB SAB Ectopic Multiple Living   1              Review of Systems  All other systems negative unless otherwise stated in HPI   Allergies  Review  of patient's allergies indicates no known allergies.  Home Medications   Prior to Admission medications   Medication Sig Start Date End Date Taking? Authorizing Aria Jarrard  diphenhydrAMINE (BENADRYL) 25 mg capsule Take 25 mg by mouth every 6 (six) hours as needed for sleep.   Yes Historical Rand Boller, MD  Prenatal Vit-Fe Fumarate-FA (PRENATAL MULTIVITAMIN) TABS tablet Take 1 tablet by mouth daily at 12 noon.   Yes Historical Reyes Aldaco, MD  acetaminophen (TYLENOL) 500 MG tablet Take 1 tablet (500 mg total) by mouth every 6 (six) hours as needed. 04/05/15   Cheri Fowler, PA-C  cephALEXin (KEFLEX) 500 MG capsule Take 1 capsule (500 mg total) by mouth 4 (four) times daily. Patient not taking: Reported on 04/05/2015 02/11/15   Catha Gosselin, PA-C  doxylamine, Sleep, (UNISOM) 25 MG tablet Take 1 tablet (25 mg total) by mouth at bedtime as needed for sleep. Patient not taking: Reported on 04/05/2015 01/11/15   Janne Napoleon, NP  ibuprofen (ADVIL,MOTRIN) 800 MG tablet Take 1 tablet (800 mg total) by mouth every 8 (eight) hours as needed for moderate pain. Patient not taking: Reported on 04/05/2015 10/12/14   Bethann Berkshire, MD  metroNIDAZOLE (METROGEL) 1 % gel Apply topically daily. Patient not taking: Reported on 04/05/2015 02/11/15   Catha Gosselin, PA-C   BP 106/58 mmHg  Pulse  68  Temp(Src) 98.4 F (36.9 C) (Oral)  Resp 21  Ht  (1.6 m)  Wt 178 lb (80.74 kg)  BMI 31.54 kg/m2  SpO2 100%  LMP 03/26/2015  Breastfeeding? Unknown Physical Exam  Constitutional: She is oriented to person, place, and time. She appears well-developed and well-nourished.  HENT:  Head: Normocephalic and atraumatic.  Mouth/Throat: Oropharynx is clear and moist.  Eyes: Pupils are equal, round, and reactive to light.  Neck: Normal range of motion. Neck supple.  No nuchal rigidity.  Cardiovascular: Normal rate, regular rhythm, normal heart sounds and intact distal pulses.   No murmur heard. Pulmonary/Chest: Effort normal  and breath sounds normal. No respiratory distress. She has no wheezes. She has no rales.  Abdominal: Soft. Bowel sounds are normal. She exhibits no distension. There is no tenderness. There is no rebound and no guarding.  Musculoskeletal: Normal range of motion.  FROM of motion of left ankle.  Strength 5/5 in flexion and extension.  Sensation intact in all toes.  Distal pulses intact.  Bony landmarks are nonTTP.  No edema, ecchymosis, or abrasions.   Lymphadenopathy:    She has no cervical adenopathy.  Neurological: She is alert and oriented to person, place, and time.  Mental Status:   AOx3 Cranial Nerves:  I-not tested  II-PERRLA  III, IV, VI-EOMs intact  V-temporal and masseter strength intact  VII-symmetrical facial movements intact, no facial droop  VIII-hearing grossly intact bilaterally  IX, X-gag intact  XI-strength of sternomastoid and trapezius muscles 5/5  XII-tongue midline Motor:   Good muscle bulk and tone  Strength 5/5 bilaterally in upper and lower extremities   Cerebellar--RAMs, finger to nose intact  No pronator drift Sensory:  Intact in upper and lower extremities      Skin: Skin is warm and dry.  Psychiatric: She has a normal mood and affect. Her behavior is normal.    ED Course  Procedures (including critical care time) Labs Review Labs Reviewed  BASIC METABOLIC PANEL - Abnormal; Notable for the following:    Calcium 8.8 (*)    All other components within normal limits  CBC WITH DIFFERENTIAL/PLATELET - Abnormal; Notable for the following:    WBC 2.8 (*)    Hemoglobin 10.7 (*)    HCT 34.2 (*)    MCH 25.5 (*)    Neutro Abs 1.5 (*)    All other components within normal limits  URINALYSIS, ROUTINE W REFLEX MICROSCOPIC (NOT AT [facility]) - Abnormal; Notable for the following:    Protein, ur 30 (*)    All other components within normal limits  URINE RAPID DRUG SCREEN, HOSP PERFORMED  MAGNESIUM  URINE MICROSCOPIC-ADD ON  CBG MONITORING, ED  I-STAT BETA  HCG BLOOD, ED ([facility], [facility], [facility] ONLY)    Imaging Review Dg Ankle Complete Left  04/05/2015   CLINICAL DATA:  Seizure today, fell and injured left ankle.  EXAM: LEFT ANKLE COMPLETE - 3+ VIEW  COMPARISON:  None.  FINDINGS: The ankle mortise is maintained. No acute ankle fracture. No osteochondral abnormality. No joint effusion. The visualized mid and hindfoot bony structures are intact. Mild pes planus.  IMPRESSION: No acute fracture.   Electronically Signed   By: Rudie Meyer M.D.   On: 04/05/2015 12:24   Ct Head Wo Contrast  04/05/2015   CLINICAL DATA:  Seizure this morning.  Headache.  EXAM: CT HEAD WITHOUT CONTRAST  TECHNIQUE: Contiguous axial images were obtained from the base of the skull through the vertex without intravenous  contrast.  COMPARISON:  None.  FINDINGS: The brain appears normal without hemorrhage, infarct, mass lesion, mass effect, midline shift or abnormal extra-axial fluid collection. No hydrocephalus or pneumocephalus. The calvarium is intact.  IMPRESSION: Normal head CT.   Electronically Signed   By: Drusilla Kanner M.D.   On: 04/05/2015 12:02   I have personally reviewed and evaluated these images and lab results as part of my medical decision-making.   EKG Interpretation   Date/Time:  Wednesday April 05 2015 11:06:01 EDT Ventricular Rate:  83 PR Interval:  136 QRS Duration: 77 QT Interval:  372 QTC Calculation: 437 R Axis:   81 Text Interpretation:  Sinus rhythm Sinus rhythm Normal ECG Confirmed by  Gerhard Munch  MD ([id number]) on 04/05/2015 11:15:20 AM      MDM   Final diagnoses:  Shaking  Left ankle pain    Pt with no seizure history presents with possible seizure activity.  VSS, NAD, pt appears non-toxic.  On exam, no focal neurological deficits.  Differential is broad at this point.  Labs ordered along with head CT.  Concern for meningitis, eclampsia, infection, seizure, intracranial mass.  Labs unremarkable.  Negative hcg.  Ct head shows no hemorrhage,  infarct, mass lesions, or mass effect.  No hydrycephalus. UA negative.  UDS negative   Doubt meningitis given no nuchal rigidity or signs of infection, eclampsia with negative hcg, mass with negative head CT.  Doubt infection given CBC.  Will likely d/c home with PCP follow up this week.  No driving until she's followed up with PCP.  VSS, pt stable for discharge.  Discussed return precautions.  Discussed no driving until after she's seen her PCP this week.  Patient agrees and acknowledges the above plan for discharge.  Will d/c home with tylenol for headache and ankle pain.  Case has been discussed with and seen by Dr. Jeraldine Loots who agrees with the above plan for discharge.      Cheri Fowler, PA-C 04/05/15 1344  Gerhard Munch, MD 04/06/15 [phone]

## 2015-04-05 NOTE — ED Notes (Signed)
Pt aware of urine sample needed,

## 2015-06-24 ENCOUNTER — Encounter (HOSPITAL_COMMUNITY): Payer: Self-pay | Admitting: Emergency Medicine

## 2015-06-24 ENCOUNTER — Emergency Department (HOSPITAL_COMMUNITY)
Admission: EM | Admit: 2015-06-24 | Discharge: 2015-06-24 | Disposition: A | Discharge status: Home / Self Care | Payer: Medicaid Other | Attending: Emergency Medicine | Admitting: Emergency Medicine

## 2015-06-24 DIAGNOSIS — Z8709 Personal history of other diseases of the respiratory system: Secondary | ICD-10-CM | POA: Insufficient documentation

## 2015-06-24 DIAGNOSIS — Z79899 Other long term (current) drug therapy: Secondary | ICD-10-CM | POA: Insufficient documentation

## 2015-06-24 DIAGNOSIS — R1030 Lower abdominal pain, unspecified: Secondary | ICD-10-CM

## 2015-06-24 DIAGNOSIS — Z87891 Personal history of nicotine dependence: Secondary | ICD-10-CM | POA: Insufficient documentation

## 2015-06-24 DIAGNOSIS — Z3202 Encounter for pregnancy test, result negative: Secondary | ICD-10-CM | POA: Insufficient documentation

## 2015-06-24 DIAGNOSIS — R350 Frequency of micturition: Secondary | ICD-10-CM | POA: Insufficient documentation

## 2015-06-24 DIAGNOSIS — R300 Dysuria: Secondary | ICD-10-CM | POA: Insufficient documentation

## 2015-06-24 DIAGNOSIS — Z8611 Personal history of tuberculosis: Secondary | ICD-10-CM | POA: Insufficient documentation

## 2015-06-24 DIAGNOSIS — R0602 Shortness of breath: Secondary | ICD-10-CM | POA: Insufficient documentation

## 2015-06-24 LAB — URINALYSIS, ROUTINE W REFLEX MICROSCOPIC
Bilirubin Urine: NEGATIVE
GLUCOSE, UA: NEGATIVE mg/dL
Hgb urine dipstick: NEGATIVE
Ketones, ur: NEGATIVE mg/dL
Leukocytes, UA: NEGATIVE
Nitrite: NEGATIVE
PROTEIN: NEGATIVE mg/dL
SPECIFIC GRAVITY, URINE: 1.025 (ref 1.005–1.030)
pH: 8 (ref 5.0–8.0)

## 2015-06-24 LAB — POC URINE PREG, ED: Preg Test, Ur: NEGATIVE

## 2015-06-24 NOTE — ED Notes (Signed)
Pt sts lower abd pain and dysuria x 5 days; pt sts LMP was 1 week ago

## 2015-06-24 NOTE — Discharge Instructions (Signed)

## 2015-06-24 NOTE — ED Provider Notes (Signed)
CSN: [id number]     Arrival date & time 06/24/15  1228 History   First MD Initiated Contact with Patient 06/24/15 1459     Chief Complaint  Patient presents with  . Abdominal Pain  . 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 Not accompanied by nausea vomiting diarrhea constipation. Penis contacts or recent travel. Pain does not radiate into the severity and improved with myself. Last menstrual period was one week ago for 3 days, normal.  She has a remote hx of chlamydia, treated years ago. Is in a monogamous relationship w/boyfriend. Denies any vaginal discomfort, odors, or discharge.   Patient is a 19 y.o. female presenting with abdominal pain and dysuria.  Abdominal Pain Pain location:  Suprapubic Pain quality: aching, cramping and pressure   Pain radiates to:  Does not radiate Pain severity:  Moderate Onset quality:  Sudden Duration:  5 days Timing:  Constant Progression:  Unchanged Chronicity:  New Associated symptoms: shortness of breath   Associated symptoms: no chest pain, no chills, no constipation, no diarrhea, no dysuria (pressure), no fever, no hematuria, no nausea, no vaginal bleeding, no vaginal discharge and no vomiting   Dysuria Associated symptoms: abdominal pain   Associated symptoms: no fever, no flank pain, no nausea, no vaginal discharge and no vomiting     Past Medical History  Diagnosis Date  . TB (pulmonary tuberculosis)     4 months ago 2015  . Strep throat    Past Surgical History  Procedure Laterality Date  . Knee surgery     Family History  Problem Relation Age of Onset  . Hypertension Mother   . Diabetes Mother    Social History  Substance Use Topics  . Smoking status: Former Games developermoker  . Smokeless tobacco: Never Used  . Alcohol Use: 3.6 oz/week    6 Shots of liquor per week     Comment: occ   OB History    Gravida Para Term Preterm AB TAB SAB Ectopic Multiple Living   1              Review of Systems  Constitutional: Negative for fever, chills and diaphoresis.  Respiratory: Positive for shortness of breath. Negative for wheezing and stridor.   Cardiovascular: Negative for chest pain, palpitations and leg swelling.  Gastrointestinal: Positive for abdominal pain. Negative for nausea, vomiting, diarrhea, constipation and abdominal distention.  Genitourinary: Positive for frequency, decreased urine volume and difficulty urinating. Negative for dysuria (pressure), urgency, hematuria, flank pain, vaginal bleeding, vaginal discharge, vaginal pain, menstrual problem and pelvic pain.  Neurological: Negative for dizziness, speech difficulty, light-headedness and headaches.  All other systems reviewed and are negative.     Allergies  Review of patient's allergies indicates no known allergies.  Home Medications   Prior to Admission medications   Medication Sig Start Date End Date Taking? Authorizing Provider  Aspirin-Cinnamedrine-Caffeine (MIDOL MAXIMUM STRENGTH PO) Take 2 tablets by mouth every 6 (six) hours as needed. For pain   Yes Historical Provider, MD  diphenhydrAMINE (BENADRYL) 25 mg capsule Take 25 mg by mouth every 6 (six) hours as needed for sleep.   Yes Historical Provider, MD  acetaminophen (TYLENOL) 500 MG tablet Take 1 tablet (500 mg total) by mouth every 6 (six) hours as needed. Patient not taking: Reported on 06/24/2015 04/05/15   Cheri FowlerKayla Rose, PA-C  cephALEXin (KEFLEX) 500 MG capsule Take 1  capsule (500 mg total) by mouth 4 (four) times daily. Patient not taking: Reported on 04/05/2015 02/11/15   Catha Gosselin, PA-C  doxylamine, Sleep, (UNISOM) 25 MG tablet Take 1 tablet (25 mg total) by mouth at bedtime as needed for sleep. Patient not taking: Reported on 04/05/2015 01/11/15   Janne Napoleon, NP  ibuprofen (ADVIL,MOTRIN) 800 MG tablet Take 1 tablet (800 mg total) by mouth every 8 (eight)  hours as needed for moderate pain. Patient not taking: Reported on 04/05/2015 10/12/14   Bethann Berkshire, MD  metroNIDAZOLE (METROGEL) 1 % gel Apply topically daily. Patient not taking: Reported on 04/05/2015 02/11/15   Catha Gosselin, PA-C  Prenatal Vit-Fe Fumarate-FA (PRENATAL MULTIVITAMIN) TABS tablet Take 1 tablet by mouth daily at 12 noon.    Historical Provider, MD   BP 120/67 mmHg  Pulse 74  Temp(Src) 98.3 F (36.8 C) (Oral)  Resp 18  Ht  (1.6 m)  Wt 81.647 kg  BMI 31.89 kg/m2  SpO2 100%  LMP 01/27/2015 Physical Exam  Constitutional: She is oriented to person, place, and time. She appears well-developed and well-nourished. No distress.  HENT:  Head: Normocephalic and atraumatic.  Eyes: Pupils are equal, round, and reactive to light.  Neck: Normal range of motion.  Cardiovascular: Normal rate.   Pulmonary/Chest: Effort normal and breath sounds normal. No respiratory distress. She has no wheezes. She has no rales. She exhibits no tenderness.  Abdominal: Soft. Bowel sounds are normal. She exhibits no distension and no mass. There is tenderness (suprapubic). There is no rebound and no guarding.  Neurological: She is alert and oriented to person, place, and time.  Skin: Skin is warm and dry. No rash noted. She is not diaphoretic.  Nursing note and vitals reviewed.   ED Course  Procedures (including critical care time) Labs Review Labs Reviewed  URINALYSIS, ROUTINE W REFLEX MICROSCOPIC (NOT AT [facility])  POC URINE PREG, ED  POC URINE PREG, ED    Imaging Review No results found. I have personally reviewed and evaluated these images and lab results as part of my medical decision-making.   EKG Interpretation None      MDM   Final diagnoses:  Lower abdominal pain    19 year old African-American female who presents today with urinary symptoms. Please see above. On exam in right ear and FDS asked. Abdomen with mild tenderness palpation in the suprapubic area. Not  consistent with torsion, ectopic, appendicitis, pancreatitis, cholecystitis, or cholelithiasis. Will obtain urine to evaluate for UTI. Will also obtain Upreg given the patient's concern for possible pregnancy despite last menstrual period one week ago.  UA negative for UTI. No blood to indicate nephrolithiasis. Upreg negative. Not c/w STI, but offered pt pelvic exam for STI check, but pt refused.   DC home.     Pt was seen under the supervision of Dr. Jodi Mourning.   Rachelle Hora, MD 06/24/15 1610  Blane Ohara, MD 06/25/15 [phone]

## 2015-07-19 ENCOUNTER — Emergency Department (HOSPITAL_COMMUNITY)
Admission: EM | Admit: 2015-07-19 | Discharge: 2015-07-19 | Disposition: A | Discharge status: Home / Self Care | Payer: Medicaid Other | Attending: Emergency Medicine | Admitting: Emergency Medicine

## 2015-07-19 ENCOUNTER — Encounter (HOSPITAL_COMMUNITY): Payer: Self-pay | Admitting: *Deleted

## 2015-07-19 DIAGNOSIS — Z79899 Other long term (current) drug therapy: Secondary | ICD-10-CM | POA: Insufficient documentation

## 2015-07-19 DIAGNOSIS — Z760 Encounter for issue of repeat prescription: Secondary | ICD-10-CM | POA: Insufficient documentation

## 2015-07-19 DIAGNOSIS — Z8709 Personal history of other diseases of the respiratory system: Secondary | ICD-10-CM | POA: Insufficient documentation

## 2015-07-19 DIAGNOSIS — Z7982 Long term (current) use of aspirin: Secondary | ICD-10-CM | POA: Insufficient documentation

## 2015-07-19 DIAGNOSIS — Z87891 Personal history of nicotine dependence: Secondary | ICD-10-CM | POA: Insufficient documentation

## 2015-07-19 DIAGNOSIS — R4589 Other symptoms and signs involving emotional state: Secondary | ICD-10-CM

## 2015-07-19 DIAGNOSIS — Z8611 Personal history of tuberculosis: Secondary | ICD-10-CM | POA: Insufficient documentation

## 2015-07-19 DIAGNOSIS — F329 Major depressive disorder, single episode, unspecified: Secondary | ICD-10-CM | POA: Insufficient documentation

## 2015-07-19 MED ORDER — HYDROXYZINE HCL 10 MG PO TABS: 10.0000 mg | ORAL_TABLET | Freq: Four times a day (QID) | ORAL | Status: AC | PRN

## 2015-07-19 NOTE — ED Provider Notes (Signed)
CSN: [id number]     Arrival date & time 07/19/15  1044 History  By signing my name below, I, Emmanuella Mensah, attest that this documentation has been prepared under the direction and in the presence of Everlene Farrier, PA-C. Electronically Signed: Angelene Giovanni, ED Scribe. 07/19/2015. 1:14 PM.    Chief Complaint  Patient presents with  . Panic Attack  . Medication Refill   The history is provided by the patient. No language interpreter was used.   HPI Comments: Sarah Norris is a 20 y.o. female who presents to the Emergency Department complaining of gradually worsening depression onset several months ago. She denies any SI/HI. She reports associated trouble sleeping, change of appetite as she is eating more, and multiple episodes of panic attacks. She denies any episodes today PTA. She denies currently seeing a therapist or a psychiatrist for her symptoms. She states that she was placed on Prozac by a physician at [facility] [employer].  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 She is looking for [facility] resources and guidance. She denies any chance of being pregnant. She denies any fever, chills, urinary symptoms, or hallucinations.   Past Medical History  Diagnosis Date  . TB (pulmonary tuberculosis)     4 months ago 2015  . Strep throat    Past Surgical History  Procedure Laterality Date  . Knee surgery     Family History  Problem Relation Age of Onset  . Hypertension Mother   . Diabetes Mother    Social History  Substance Use Topics  . Smoking status: Former Games developer  . Smokeless tobacco: Never Used  . Alcohol Use: 3.6 oz/week    6 Shots of liquor per week     Comment: occ   OB History    Gravida Para Term Preterm AB TAB SAB Ectopic Multiple Living   1              Review of Systems  Constitutional: Positive for appetite change. Negative for fever and chills.       Trouble sleeping  Respiratory: Negative  for cough.   Gastrointestinal: Negative for vomiting and diarrhea.  Genitourinary: Negative for dysuria, frequency and hematuria.  Skin: Negative for rash.  Neurological: Negative for syncope.  Psychiatric/Behavioral: Positive for sleep disturbance and dysphoric mood. Negative for suicidal ideas, hallucinations, behavioral problems, self-injury and agitation. The patient is nervous/anxious.        Increased depression      Allergies  Review of patient's allergies indicates no known allergies.  Home Medications   Prior to Admission medications   Medication Sig Start Date End Date Taking? Authorizing Provider  acetaminophen (TYLENOL) 500 MG tablet Take 1 tablet (500 mg total) by mouth every 6 (six) hours as needed. Patient not taking: Reported on 06/24/2015 04/05/15   Cheri Fowler, PA-C  Aspirin-Cinnamedrine-Caffeine (MIDOL MAXIMUM STRENGTH PO) Take 2 tablets by mouth every 6 (six) hours as needed. For pain    Historical Provider, MD  cephALEXin (KEFLEX) 500 MG capsule Take 1 capsule (500 mg total) by mouth 4 (four) times daily. Patient not taking: Reported on 04/05/2015 02/11/15   Catha Gosselin, PA-C  diphenhydrAMINE (BENADRYL) 25 mg capsule Take 25 mg by mouth every 6 (six) hours as needed for sleep.    Historical Provider, MD  doxylamine, Sleep, (UNISOM) 25 MG tablet Take 1 tablet (25 mg total) by mouth at bedtime as needed for sleep. Patient not  taking: Reported on 04/05/2015 01/11/15   Janne NapoleonHope M Neese, NP  hydrOXYzine (ATARAX/VISTARIL) 10 MG tablet Take 1 tablet (10 mg total) by mouth every 6 (six) hours as needed for anxiety. 07/19/15   Everlene FarrierWilliam Jasenia Weilbacher, PA-C  ibuprofen (ADVIL,MOTRIN) 800 MG tablet Take 1 tablet (800 mg total) by mouth every 8 (eight) hours as needed for moderate pain. Patient not taking: Reported on 04/05/2015 10/12/14   Bethann BerkshireJoseph Zammit, MD  metroNIDAZOLE (METROGEL) 1 % gel Apply topically daily. Patient not taking: Reported on 04/05/2015 02/11/15   Catha GosselinHanna Patel-Mills, PA-C  Prenatal  Vit-Fe Fumarate-FA (PRENATAL MULTIVITAMIN) TABS tablet Take 1 tablet by mouth daily at 12 noon.    Historical Provider, MD   BP 110/70 mmHg  Pulse 78  Temp(Src) 98.1 F (36.7 C) (Oral)  Resp 20  SpO2 100%  LMP 07/12/2015 Physical Exam  Constitutional: She is oriented to person, place, and time. She appears well-developed and well-nourished. No distress.  Nontoxic appearing.  HENT:  Head: Normocephalic and atraumatic.  Right Ear: External ear normal.  Left Ear: External ear normal.  Mouth/Throat: Oropharynx is clear and moist.  Eyes: Conjunctivae are normal. Pupils are equal, round, and reactive to light. Right eye exhibits no discharge. Left eye exhibits no discharge.  Neck: Neck supple.  Cardiovascular: Normal rate, regular rhythm, normal heart sounds and intact distal pulses.   Pulmonary/Chest: Effort normal and breath sounds normal. No respiratory distress. She has no wheezes. She has no rales.  Abdominal: Soft. There is no tenderness.  Lymphadenopathy:    She has no cervical adenopathy.  Neurological: She is alert and oriented to person, place, and time. Coordination normal.  Skin: Skin is warm and dry. No rash noted. She is not diaphoretic. No erythema. No pallor.  Psychiatric: Her behavior is normal. Thought content normal. Her mood appears not anxious. Her speech is not rapid and/or pressured and not slurred. She is not actively hallucinating. Thought content is not paranoid and not delusional. She exhibits a depressed mood. She expresses no homicidal and no suicidal ideation.  Patient appears slightly depressed. She makes good eye contact. She denies suicidal or homicidal ideations. She denies visual or auditory hallucinations. Her speech is clear and coherent. She is alert and oriented 3.  Nursing note and vitals reviewed.   ED Course  Procedures (including critical care time) DIAGNOSTIC STUDIES: Oxygen Saturation is 100% on RA, normal by my interpretation.     COORDINATION OF CARE: 1:10 PM- Pt advised of plan for treatment and pt agrees. Pt will receive Atarax and given a list of resources to contact for follow up with her depression.     MDM   Meds given in ED:  Medications - No data to display  Discharge Medication List as of 07/19/2015  1:12 PM    START taking these medications   Details  hydrOXYzine (ATARAX/VISTARIL) 10 MG tablet Take 1 tablet (10 mg total) by mouth every 6 (six) hours as needed for anxiety., Starting 07/19/2015, Until Discontinued, Print        Final diagnoses:  Depressed mood   This  Is a 20 y.o. female with history of depression and anxiety who presents the emergency department complaining of increasing depression and anxiety over the past several weeks. She reports she is no longer taking her antidepressants. She reports she is not seeing a psychiatrist or therapist currently. She denies suicidal or homicidal ideations. On exam the patient is afebrile nontoxic appearing. She appears slightly depressed. She makes good eye contact. She  denies suicidal or homicidal ideations. Her speech is clear and coherent. Patient with depressed mood. She does not appear to be a threat to herself or others. We'll discharge with prescription for Atarax for anxiety. Also discussed methods with how to help with sleep and relaxation. I provided her with P of her health resources and I encouraged her to follow-up with [facility]. I discussed return precautions. I advised if she has any thoughts about wanting to hurt herself or other people's needs to call 911 or return to the emergency department immediately. I advised the patient to follow-up with their primary care provider this week. I advised the patient to return to the emergency department with new or worsening symptoms or new concerns. The patient verbalized understanding and agreement with plan.    I personally performed the services described in this documentation, which was scribed in my  presence. The recorded information has been reviewed and is accurate.      Everlene Farrier, PA-C 07/19/15 1410  Blane Ohara, MD 07/22/15 [phone]

## 2015-07-19 NOTE — ED Notes (Signed)
Declined W/C at D/C and was escorted to lobby by RN. 

## 2015-07-19 NOTE — Discharge Instructions (Signed)
Generalized Anxiety Disorder Generalized anxiety disorder (GAD) is a mental disorder. It interferes with life functions, including relationships, work, and school. GAD is different from normal anxiety, which everyone experiences at some point in their lives in response to specific life events and activities. Normal anxiety actually helps us prepare for and get through these life events and activities. Normal anxiety goes away after the event or activity is over.  GAD causes anxiety that is not necessarily related to specific events or activities. It also causes excess anxiety in proportion to specific events or activities. The anxiety associated with GAD is also difficult to control. GAD can vary from mild to severe. People with severe GAD can have intense waves of anxiety with physical symptoms (panic attacks).  SYMPTOMS The anxiety and worry associated with GAD are difficult to control. This anxiety and worry are related to many life events and activities and also occur more days than not for 6 months or longer. People with GAD also have three or more of the following symptoms (one or more in children):  Restlessness.   Fatigue.  Difficulty concentrating.   Irritability.  Muscle tension.  Difficulty sleeping or unsatisfying sleep. DIAGNOSIS GAD is diagnosed through an assessment by your health care provider. Your health care provider will ask you questions aboutyour mood,physical symptoms, and events in your life. Your health care provider may ask you about your medical history and use of alcohol or drugs, including prescription medicines. Your health care provider may also do a physical exam and blood tests. Certain medical conditions and the use of certain substances can cause symptoms similar to those associated with GAD. Your health care provider may refer you to a mental health specialist for further evaluation. TREATMENT The following therapies are usually used to treat GAD:    Medication. Antidepressant medication usually is prescribed for long-term daily control. Antianxiety medicines may be added in severe cases, especially when panic attacks occur.   Talk therapy (psychotherapy). Certain types of talk therapy can be helpful in treating GAD by providing support, education, and guidance. A form of talk therapy called cognitive behavioral therapy can teach you healthy ways to think about and react to daily life events and activities.  Stress managementtechniques. These include yoga, meditation, and exercise and can be very helpful when they are practiced regularly. A mental health specialist can help determine which treatment is best for you. Some people see improvement with one therapy. However, other people require a combination of therapies.   This information is not intended to replace advice given to you by your health care provider. Make sure you discuss any questions you have with your health care provider.   Document Released: 10/19/2012 Document Revised: 07/15/2014 Document Reviewed: 10/19/2012 Elsevier Interactive Patient Education 2016 Elsevier Inc. Major Depressive Disorder Major depressive disorder is a mental illness. It also may be called clinical depression or unipolar depression. Major depressive disorder usually causes feelings of sadness, hopelessness, or helplessness. Some people with this disorder do not feel particularly sad but lose interest in doing things they used to enjoy (anhedonia). Major depressive disorder also can cause physical symptoms. It can interfere with work, school, relationships, and other normal everyday activities. The disorder varies in severity but is longer lasting and more serious than the sadness we all feel from time to time in our lives. Major depressive disorder often is triggered by stressful life events or major life changes. Examples of these triggers include divorce, loss of your job or home, a move,  move, and the  death of a family member or close friend. Sometimes this disorder occurs for no obvious reason at all. People who have family members with major depressive disorder or bipolar disorder are at higher risk for developing this disorder, with or without life stressors. Major depressive disorder can occur at any age. It may occur just once in your life (single episode major depressive disorder). It may occur multiple times (recurrent major depressive disorder). °SYMPTOMS °People with major depressive disorder have either anhedonia or depressed mood on nearly a daily basis for at least 2 weeks or longer. Symptoms of depressed mood include: °· Feelings of sadness (blue or down in the dumps) or emptiness. °· Feelings of hopelessness or helplessness. °· Tearfulness or episodes of crying (may be observed by others). °· Irritability (children and adolescents). °In addition to depressed mood or anhedonia or both, people with this disorder have at least four of the following symptoms: °· Difficulty sleeping or sleeping too much.   °· Significant change (increase or decrease) in appetite or weight.   °· Lack of energy or motivation. °· Feelings of guilt and worthlessness.   °· Difficulty concentrating, remembering, or making decisions. °· Unusually slow movement (psychomotor retardation) or restlessness (as observed by others).   °· Recurrent wishes for death, recurrent thoughts of self-harm (suicide), or a suicide attempt. °People with major depressive disorder commonly have persistent negative thoughts about themselves, other people, and the world. People with severe major depressive disorder may experience distorted beliefs or perceptions about the world (psychotic delusions). They also may see or hear things that are not real (psychotic hallucinations). °DIAGNOSIS °Major depressive disorder is diagnosed through an assessment by your health care provider. Your health care provider will ask about aspects of your daily life,  such as mood, sleep, and appetite, to see if you have the diagnostic symptoms of major depressive disorder. Your health care provider may ask about your medical history and use of alcohol or drugs, including prescription medicines. Your health care provider also may do a physical exam and blood work. This is because certain medical conditions and the use of certain substances can cause major depressive disorder-like symptoms (secondary depression). Your health care provider also may refer you to a mental health specialist for further evaluation and treatment. °TREATMENT °It is important to recognize the symptoms of major depressive disorder and seek treatment. The following treatments can be prescribed for this disorder:   °· Medicine. Antidepressant medicines usually are prescribed. Antidepressant medicines are thought to correct chemical imbalances in the brain that are commonly associated with major depressive disorder. Other types of medicine may be added if the symptoms do not respond to antidepressant medicines alone or if psychotic delusions or hallucinations occur. °· Talk therapy. Talk therapy can be helpful in treating major depressive disorder by providing support, education, and guidance. Certain types of talk therapy also can help with negative thinking (cognitive behavioral therapy) and with relationship issues that trigger this disorder (interpersonal therapy). °A mental health specialist can help determine which treatment is best for you. Most people with major depressive disorder do well with a combination of medicine and talk therapy. Treatments involving electrical stimulation of the brain can be used in situations with extremely severe symptoms or when medicine and talk therapy do not work over time. These treatments include electroconvulsive therapy, transcranial magnetic stimulation, and vagal nerve stimulation. °  °This information is not intended to replace advice given to you by your health  care provider. Make sure you discuss any questions you have with   your health care provider. °  °Document Released: 10/19/2012 Document Revised: 07/15/2014 Document Reviewed: 10/19/2012 °Elsevier Interactive Patient Education ©2016 Elsevier Inc. °Substance Abuse Treatment Programs ° °Intensive Outpatient Programs °[facility]     °[address]      °[place], [place]                   °336-878-6098      ° °[facility] °[address] °[place], [place] °[phone] ° °[facility]     °(Inpatient and outpatient)     °[address]           °[phone]   ° °[facility] °[phone] (Suboxone and Methadone) ° °[address]      °[place], [place] [address]      °[phone]      ° °[address] °[place], [place] °852-3033 ° °[facility] (Outpatient/Inpatient, [facility])    °(insurance only) [phone]      °       °[facility] (Groups & Residential) °[place], [place] °[phone] ° °   °[facility]     °[address]     °[place], [place]      °[phone]      ° °[facility] (for caregivers and family) °[address] °[place], [place] °336-299-4655 ° ° ° ° ° °Residential Treatment Programs °[facility]      °[address], [place], [place] [address]  °([phone]      ° °[facility] °[address] [place], [place] [address] °[phone] ° °[facility]        °336-248-8914      ° °[facility] °1-800-659-3381 ° °[facility]             °[address]                                         °[place], [place]                                                °877-615-2722 or 336-784-9470                              ° °[facility] [place] °[address] °[place], [address] °[phone] ° °[facility]    °[address]      °[place], [place]     °[phone]      ° °[facility] °[address] °[place], [place] °[phone] ° °[facility]   °[address]     °[place], [place] [address]     °[phone]      °Admissions: 8am-3pm M-F ° °[facility] °[address] °[place], [place] °336-227-7417 ° °[facility]: Residential Program (90 Days)   °[place], [place]      °336-725-8389 or [phone]    ° °[facility] °[place], [place] °(Walk in Hours over the weekend or by referral) ° °[facility] °[address], [place], [place] [address] °([phone]) [phone] ° °Crisis Mobile: [facility]:  1-877-626-1772 (for crisis response   24 hours a day) °[facility] Hotline:      1-800-256-2452 °Outpatient Psychiatry and Counseling ° °[facility]: Mobile Crisis Management 24 hours:  1-877-626-1772 ° °[facility] sliding scale fee and walk in schedule: M-F 8am-12pm/1pm-3pm °[address]  °[place], [place] [address] °[phone] ° °[facility] °[address] °[place], [place] [address] °[phone] ° °[facility] ([facility])- new patient walk-in appointments available Monday - Friday 8am -3pm.          °[address] °[place], [place] [address] °336-676-6840 or crisis line- 336-676-6905 ° °[facility]/ Intensive Outpatient Therapy Program °[address] °[place], [place] [address] °336-832-9804 ° °[facility]                  °Crisis Services      °[phone]      °[address]     °[place], [place] [address]                ° °[facility]   °[facility] °[phone] °[address] °[place], [place] [address] ° ° °[facility]          °[address],  °[place], [place] [address]       °([phone] ° °[facility] °[address] °[place], [place] [address] °336-292-1510 ° °[facility]    °[address]    °[place], [place] [address]     °[phone]      ° °Parish McKinney, MD     °[address]     °[place] [address]     °336-282-1251     °  °[facility] °[address] °[place] [address] ° °[facility]     °[address]     °[place], [place]      °336-542-2076      ° °[facility] °Shamsher Ahluwalia, MD °[address] °[place], [place] [address] °[phone] ° °[facility]     °[address]     °[place], [place] [address]     °336-274-1237      ° °[facility] °[address] °[place], [place] [address] °336-854-4450 °Resources for Temporary Residential Assistance/Crisis Centers ° °DAY CENTERS °[facility] °M-F 8am-3pm   °[address] [place], [place] [address]   336-332-0824 °Services include: laundry, barbering, support groups, case management, phone  & computer access, showers, AA/NA mtgs, mental health/substance abuse nurse, job skills class, disability information, VA assistance, spiritual classes, etc.  ° °HOMELESS SHELTERS ° °[facility]     °[facility]   °[address], [place]     °[phone]       °       °[facility] (women and children)       °[address] °[place], [place] [address] °336-275-0820 °[email or web address] for application and process °Application Required ° °[facility]   °[address]    °[place] [address]     °[phone]       °             °[facility] °[address] °[place], [place] [address] °336.273.5572 °[phone](schedule application appt.) °Application Required ° °[facility] (women only)    °  [address]     [place], [place] [address]     [phone]      Intake starts 6pm daily Need valid ID, SSC, & Police report [employer] [address] [place], [place] [phone] Application  Required  [facility] (men only)     [address]      [place], [place]     [phone]       [facility] (Pregnant women only) [address] [place], [place] [phone]  [facility]      [address]      [place], [place] [address]     [phone]             [facility] [address] [place], [place] [phone] 90 day commitment/SA/Application process  [facility](men only)     [address]     [place], [place]     [phone]       Check-in at [facility] [address] [place], [place] [address] [phone] Men/Women/Women and Children must be there by 7 pm  [facility] [place], [place] [phone]

## 2015-07-19 NOTE — ED Notes (Addendum)
Pt rpeorts panic attacks and depression. Pt states that she has hx of same. Pt states that she has had difficulty sleeping as well. Pt reports not having an MD since graduating college and states that she is out of her medications and needs prescriptions. Denies SI/HI.

## 2015-07-19 NOTE — ED Notes (Signed)
PT denies SI and HI.

## 2015-11-28 ENCOUNTER — Inpatient Hospital Stay (HOSPITAL_COMMUNITY)
Admission: AD | Admit: 2015-11-28 | Discharge: 2015-11-28 | Disposition: A | Discharge status: Home / Self Care | Payer: Self-pay | Source: Ambulatory Visit | Attending: Obstetrics & Gynecology | Admitting: Obstetrics & Gynecology

## 2015-11-28 ENCOUNTER — Encounter (HOSPITAL_COMMUNITY): Payer: Self-pay | Admitting: Student

## 2015-11-28 DIAGNOSIS — R109 Unspecified abdominal pain: Secondary | ICD-10-CM | POA: Insufficient documentation

## 2015-11-28 DIAGNOSIS — Z87891 Personal history of nicotine dependence: Secondary | ICD-10-CM | POA: Insufficient documentation

## 2015-11-28 DIAGNOSIS — R112 Nausea with vomiting, unspecified: Secondary | ICD-10-CM | POA: Insufficient documentation

## 2015-11-28 LAB — CBC
HCT: 32.3 % — ABNORMAL LOW (ref 36.0–46.0)
HEMOGLOBIN: 10.2 g/dL — AB (ref 12.0–15.0)
MCH: 23.4 pg — AB (ref 26.0–34.0)
MCHC: 31.6 g/dL (ref 30.0–36.0)
MCV: 74.3 fL — ABNORMAL LOW (ref 78.0–100.0)
Platelets: 211 10*3/uL (ref 150–400)
RBC: 4.35 MIL/uL (ref 3.87–5.11)
RDW: 14.1 % (ref 11.5–15.5)
WBC: 4.5 10*3/uL (ref 4.0–10.5)

## 2015-11-28 LAB — URINALYSIS, ROUTINE W REFLEX MICROSCOPIC
BILIRUBIN URINE: NEGATIVE
GLUCOSE, UA: NEGATIVE mg/dL
Hgb urine dipstick: NEGATIVE
KETONES UR: NEGATIVE mg/dL
Leukocytes, UA: NEGATIVE
Nitrite: NEGATIVE
Protein, ur: NEGATIVE mg/dL
Specific Gravity, Urine: 1.02 (ref 1.005–1.030)
pH: 6 (ref 5.0–8.0)

## 2015-11-28 LAB — WET PREP, GENITAL
Sperm: NONE SEEN
Trich, Wet Prep: NONE SEEN
YEAST WET PREP: NONE SEEN

## 2015-11-28 LAB — GC/CHLAMYDIA PROBE AMP (~~LOC~~) NOT AT ARMC
Chlamydia: NEGATIVE
Neisseria Gonorrhea: NEGATIVE

## 2015-11-28 LAB — POCT PREGNANCY, URINE: Preg Test, Ur: NEGATIVE

## 2015-11-28 MED ORDER — IBUPROFEN 600 MG PO TABS
600.0000 mg | ORAL_TABLET | Freq: Once | ORAL | Status: AC
  Administered 2015-11-28: 600 mg via ORAL
  Filled 2015-11-28: qty 1

## 2015-11-28 MED ORDER — PROMETHAZINE HCL 25 MG PO TABS: 25.0000 mg | ORAL_TABLET | Freq: Four times a day (QID) | ORAL | Status: AC | PRN

## 2015-11-28 NOTE — MAU Provider Note (Signed)
History     CSN: [id number]  Arrival date and time: 11/28/15 2841   First Provider Initiated Contact with Patient 11/28/15 [phone]         Chief Complaint  Patient presents with  . Abdominal Cramping  . Emesis   HPI  Sarah Norris is a 20 y.o. female who presents for abdominal pain & n/v. Suprapubic cramping began 1 week ago. Symptoms come & go. Rates 6/10. Has not treated. Denies dysuria, hematuria, increased frequency, fever/chills, or vaginal bleeding. Also reports vomiting twice tonight while at work. Denies nausea since then. Denies diarrhea or constipation. States she felt light headed while she was vomiting; denies syncope.    OB History    Gravida Para Term Preterm AB TAB SAB Ectopic Multiple Living   Past Medical History  Diagnosis Date  . TB (pulmonary tuberculosis) 2015  . Strep throat     Past Surgical History  Procedure Laterality Date  . Knee surgery      Family History  Problem Relation Age of Onset  . Hypertension Mother   . Diabetes Mother     Social History  Substance Use Topics  . Smoking status: Former Games developer  . Smokeless tobacco: Never Used  . Alcohol Use: 3.6 oz/week    6 Shots of liquor per week     Comment: occ    Allergies: No Known Allergies  Prescriptions prior to admission  Medication Sig Dispense Refill Last Dose  . acetaminophen (TYLENOL) 500 MG tablet Take 1 tablet (500 mg total) by mouth every 6 (six) hours as needed. (Patient not taking: Reported on 06/24/2015) 20 tablet 0   . Aspirin-Cinnamedrine-Caffeine (MIDOL MAXIMUM STRENGTH PO) Take 2 tablets by mouth every 6 (six) hours as needed. For pain   06/23/2015 at Unknown time  . cephALEXin (KEFLEX) 500 MG capsule Take 1 capsule (500 mg total) by mouth 4 (four) times daily. (Patient not taking: Reported on 04/05/2015) 20 capsule 0   . diphenhydrAMINE (BENADRYL) 25 mg capsule Take 25 mg by mouth every 6 (six) hours as needed for sleep.   Past Month at Unknown  time  . doxylamine, Sleep, (UNISOM) 25 MG tablet Take 1 tablet (25 mg total) by mouth at bedtime as needed for sleep. (Patient not taking: Reported on 04/05/2015) 20 tablet 0   . hydrOXYzine (ATARAX/VISTARIL) 10 MG tablet Take 1 tablet (10 mg total) by mouth every 6 (six) hours as needed for anxiety. 15 tablet 0   . ibuprofen (ADVIL,MOTRIN) 800 MG tablet Take 1 tablet (800 mg total) by mouth every 8 (eight) hours as needed for moderate pain. (Patient not taking: Reported on 04/05/2015) 21 tablet 0 01/11/2015 at Unknown time  . metroNIDAZOLE (METROGEL) 1 % gel Apply topically daily. (Patient not taking: Reported on 04/05/2015) 45 g 0   . Prenatal Vit-Fe Fumarate-FA (PRENATAL MULTIVITAMIN) TABS tablet Take 1 tablet by mouth daily at 12 noon.   Past Week at Unknown time    Review of Systems  Constitutional: Negative.   Gastrointestinal: Positive for vomiting and abdominal pain. Negative for nausea, diarrhea and constipation.  Genitourinary: Negative for dysuria, frequency and hematuria.       No vaginal bleeding or discharge   Physical Exam   Blood pressure 120/83, pulse 83, temperature 98.5 F (36.9 C), temperature source Oral, resp. rate 19, height  (1.6 m), weight 227 lb (102.967 kg), last menstrual period 10/31/2014, SpO2 100 %,  unknown if currently breastfeeding.  Physical Exam  Nursing note and vitals reviewed. Constitutional: She is oriented to person, place, and time. She appears well-developed and well-nourished. No distress.  HENT:  Head: Normocephalic and atraumatic.  Eyes: Conjunctivae are normal. Right eye exhibits no discharge. Left eye exhibits no discharge. No scleral icterus.  Neck: Normal range of motion.  Cardiovascular: Normal rate, regular rhythm and normal heart sounds.   No murmur heard. Respiratory: Effort normal and breath sounds normal. No respiratory distress. She has no wheezes.  GI: Soft. Bowel sounds are normal. She exhibits no distension and no mass. There is  no tenderness. There is no rebound and no guarding.  Neurological: She is alert and oriented to person, place, and time.  Skin: Skin is warm and dry. She is not diaphoretic.  Psychiatric: She has a normal mood and affect. Her behavior is normal. Judgment and thought content normal.    MAU Course  Procedures Results for orders placed or performed during the hospital encounter of 11/28/15 (from the past 24 hour(s))  GC/Chlamydia probe amp ([facility])not at [facility]     Status: None   Collection Time: 11/28/15 12:00 AM  Result Value Ref Range   Chlamydia Negative    Neisseria gonorrhea Negative   Urinalysis, Routine w reflex microscopic (not at [facility])     Status: None   Collection Time: 11/28/15  4:00 AM  Result Value Ref Range   Color, Urine YELLOW YELLOW   APPearance CLEAR CLEAR   Specific Gravity, Urine 1.020 1.005 - 1.030   pH 6.0 5.0 - 8.0   Glucose, UA NEGATIVE NEGATIVE mg/dL   Hgb urine dipstick NEGATIVE NEGATIVE   Bilirubin Urine NEGATIVE NEGATIVE   Ketones, ur NEGATIVE NEGATIVE mg/dL   Protein, ur NEGATIVE NEGATIVE mg/dL   Nitrite NEGATIVE NEGATIVE   Leukocytes, UA NEGATIVE NEGATIVE  Pregnancy, urine POC     Status: None   Collection Time: 11/28/15  4:26 AM  Result Value Ref Range   Preg Test, Ur NEGATIVE NEGATIVE  CBC     Status: Abnormal   Collection Time: 11/28/15  4:52 AM  Result Value Ref Range   WBC 4.5 4.0 - 10.5 K/uL   RBC 4.35 3.87 - 5.11 MIL/uL   Hemoglobin 10.2 (L) 12.0 - 15.0 g/dL   HCT 16.132.3 (L) 09.636.0 - 04.546.0 %   MCV 74.3 (L) 78.0 - 100.0 fL   MCH 23.4 (L) 26.0 - 34.0 pg   MCHC 31.6 30.0 - 36.0 g/dL   RDW 40.914.1 81.111.5 - 91.415.5 %   Platelets 211 150 - 400 K/uL  Wet prep, genital     Status: Abnormal   Collection Time: 11/28/15  5:05 AM  Result Value Ref Range   Yeast Wet Prep HPF POC NONE SEEN NONE SEEN   Trich, Wet Prep NONE SEEN NONE SEEN   Clue Cells Wet Prep HPF POC PRESENT (A) NONE SEEN   WBC, Wet Prep HPF POC FEW (A) NONE SEEN   Sperm NONE SEEN      MDM UPT negative Ibuprofen 600 mg PO Denies nausea at this time Pt reports improvement in symptoms  Assessment and Plan  A: 1. Abdominal cramping   2. Non-intractable vomiting with nausea, unspecified vomiting type    P; Discharge home GC/CT pending Discussed OTC meds for symptoms treatment If s/s worsen go to PCP, urgent care, or ED  Judeth HornErin Demarr Kluever 11/28/2015, 4:38 AM

## 2015-11-28 NOTE — MAU Note (Signed)
Pt reports lower abd pain x one week. Nausea and vomiting.

## 2015-11-28 NOTE — Discharge Instructions (Signed)

## 2016-02-16 ENCOUNTER — Inpatient Hospital Stay (HOSPITAL_COMMUNITY)
Admission: AD | Admit: 2016-02-16 | Discharge: 2016-02-16 | Disposition: A | Discharge status: Home / Self Care | Payer: Self-pay | Source: Ambulatory Visit | Attending: Family Medicine | Admitting: Family Medicine

## 2016-02-16 ENCOUNTER — Encounter (HOSPITAL_COMMUNITY): Payer: Self-pay | Admitting: *Deleted

## 2016-02-16 DIAGNOSIS — K529 Noninfective gastroenteritis and colitis, unspecified: Secondary | ICD-10-CM | POA: Insufficient documentation

## 2016-02-16 DIAGNOSIS — Z87891 Personal history of nicotine dependence: Secondary | ICD-10-CM | POA: Insufficient documentation

## 2016-02-16 LAB — POCT PREGNANCY, URINE: Preg Test, Ur: NEGATIVE

## 2016-02-16 LAB — URINALYSIS, ROUTINE W REFLEX MICROSCOPIC
BILIRUBIN URINE: NEGATIVE
Glucose, UA: NEGATIVE mg/dL
Hgb urine dipstick: NEGATIVE
KETONES UR: NEGATIVE mg/dL
LEUKOCYTES UA: NEGATIVE
NITRITE: NEGATIVE
PROTEIN: 30 mg/dL — AB
Specific Gravity, Urine: 1.015 (ref 1.005–1.030)
pH: 8.5 — ABNORMAL HIGH (ref 5.0–8.0)

## 2016-02-16 LAB — URINE MICROSCOPIC-ADD ON: WBC UA: NONE SEEN WBC/hpf (ref 0–5)

## 2016-02-16 MED ORDER — ONDANSETRON 4 MG PO TBDP: 4.0000 mg | ORAL_TABLET | Freq: Three times a day (TID) | ORAL | 0 refills | Status: AC | PRN

## 2016-02-16 NOTE — MAU Note (Signed)
Pt states she became very nauseated and started vomited this morning at 0700 while getting ready for work.  Diarrhea as well this morning.  Doesn't know if she is pregnant.

## 2016-02-16 NOTE — MAU Provider Note (Signed)
History     CSN: [id number]  Arrival date and time: 02/16/16 1235   First Provider Initiated Contact with Patient 02/16/16 1322      Chief Complaint  Patient presents with  . Emesis   HPI   [name] is a 20 y.o. female [id number]; non-pregnant female here with N/V/D.  She woke up vomiting today. She has vomited 6 x since she woke up this morning.  She has also had 2 episodes of diarrhea. No one else in her house is sick.  She was seen in the ED for this in May. She has not seen a PCP for this. She denies fever or chills. She denies abdominal pain. She is requesting a work note today.   OB History    Gravida Para Term Preterm AB Living   1       1 0   SAB TAB Ectopic Multiple Live Births   1              Past Medical History:  Diagnosis Date  . Strep throat   . TB (pulmonary tuberculosis) 2015    Past Surgical History:  Procedure Laterality Date  . KNEE SURGERY      Family History  Problem Relation Age of Onset  . Hypertension Mother   . Diabetes Mother     Social History  Substance Use Topics  . Smoking status: Former Games developermoker  . Smokeless tobacco: Never Used  . Alcohol use 3.6 oz/week    6 Shots of liquor per week     Comment: occ    Allergies: No Known Allergies  No prescriptions prior to admission.   Results for orders placed or performed during the hospital encounter of 02/16/16 (from the past 48 hour(s))  Urinalysis, Routine w reflex microscopic (not [facility])     Status: Abnormal   Collection Time: 02/16/16 12:48 PM  Result Value Ref Range   Color, Urine YELLOW YELLOW   APPearance CLEAR CLEAR   Specific Gravity, Urine 1.015 1.005 - 1.030   pH 8.5 (H) 5.0 - 8.0   Glucose, UA NEGATIVE NEGATIVE mg/dL   Hgb urine dipstick NEGATIVE NEGATIVE   Bilirubin Urine NEGATIVE NEGATIVE   Ketones, ur NEGATIVE NEGATIVE mg/dL   Protein, ur 30 (A) NEGATIVE mg/dL   Nitrite NEGATIVE NEGATIVE   Leukocytes, UA NEGATIVE NEGATIVE  Urine microscopic-add on      Status: Abnormal   Collection Time: 02/16/16 12:48 PM  Result Value Ref Range   Squamous Epithelial / LPF 0-5 (A) NONE SEEN   WBC, UA NONE SEEN 0 - 5 WBC/hpf   RBC / HPF 0-5 0 - 5 RBC/hpf   Bacteria, UA RARE (A) NONE SEEN  Pregnancy, urine POC     Status: None   Collection Time: 02/16/16 12:49 PM  Result Value Ref Range   Preg Test, Ur NEGATIVE NEGATIVE    Comment:        THE SENSITIVITY OF THIS METHODOLOGY IS >24 mIU/mL     Review of Systems  Constitutional: Negative for chills and fever.  Gastrointestinal: Positive for diarrhea, nausea and vomiting. Negative for abdominal pain and heartburn.   Physical Exam   Blood pressure 118/71, pulse 81, temperature 98.4 F (36.9 C), temperature source Oral, resp. rate 18, height 5' 1.25" (1.556 m), weight 229 lb (103.9 kg), last menstrual period 01/19/2016, unknown if currently breastfeeding.  Physical Exam  Constitutional: She is oriented to person, place, and time. She appears well-developed and well-nourished.  Non-toxic appearance.  She does not have a sickly appearance. She does not appear ill. No distress.  HENT:  Head: Normocephalic.  Eyes: Pupils are equal, round, and reactive to light.  Respiratory: Effort normal.  GI: Soft.  Musculoskeletal: Normal range of motion.  Neurological: She is alert and oriented to person, place, and time.  Skin: Skin is warm. She is not diaphoretic.  Psychiatric: Her behavior is normal.    MAU Course  Procedures  None  MDM  UA UPT   Assessment and Plan    A:  1. Acute gastroenteritis     P:  Discharge home in stable condition Rx: Zofran Discussed the importance of PCP At home pregnancy test encouraged Work note provided If symptoms worsen, patient instructed to go to [facility] or [facility] ED    Duane Lope, NP 02/16/2016 1:54 PM

## 2016-02-16 NOTE — Discharge Instructions (Signed)
Food Choices to Help Relieve Diarrhea, Adult °When you have diarrhea, the foods you eat and your eating habits are very important. Choosing the right foods and drinks can help relieve diarrhea. Also, because diarrhea can last up to 7 days, you need to replace lost fluids and electrolytes (such as sodium, potassium, and chloride) in order to help prevent dehydration.  °WHAT GENERAL GUIDELINES DO I NEED TO FOLLOW? °· Slowly drink 1 cup (8 oz) of fluid for each episode of diarrhea. If you are getting enough fluid, your urine will be clear or pale yellow. °· Eat starchy foods. Some good choices include white rice, white toast, pasta, low-fiber cereal, baked potatoes (without the skin), saltine crackers, and bagels. °· Avoid large servings of any cooked vegetables. °· Limit fruit to two servings per day. A serving is ½ cup or 1 small piece. °· Choose foods with less than 2 g of fiber per serving. °· Limit fats to less than 8 tsp (38 g) per day. °· Avoid fried foods. °· Eat foods that have probiotics in them. Probiotics can be found in certain dairy products. °· Avoid foods and beverages that may increase the speed at which food moves through the stomach and intestines (gastrointestinal tract). Things to avoid include: °¨ High-fiber foods, such as dried fruit, raw fruits and vegetables, nuts, seeds, and whole grain foods. °¨ Spicy foods and high-fat foods. °¨ Foods and beverages sweetened with high-fructose corn syrup, honey, or sugar alcohols such as xylitol, sorbitol, and mannitol. °WHAT FOODS ARE RECOMMENDED? °Grains °White rice. White, French, or pita breads (fresh or toasted), including plain rolls, buns, or bagels. White pasta. Saltine, soda, or graham crackers. Pretzels. Low-fiber cereal. Cooked cereals made with water (such as cornmeal, farina, or cream cereals). Plain muffins. Matzo. Melba toast. Zwieback.  °Vegetables °Potatoes (without the skin). Strained tomato and vegetable juices. Most well-cooked and canned  vegetables without seeds. Tender lettuce. °Fruits °Cooked or canned applesauce, apricots, cherries, fruit cocktail, grapefruit, peaches, pears, or plums. Fresh bananas, apples without skin, cherries, grapes, cantaloupe, grapefruit, peaches, oranges, or plums.  °Meat and Other Protein Products °Baked or boiled chicken. Eggs. Tofu. Fish. Seafood. Smooth peanut butter. Ground or well-cooked tender beef, ham, veal, lamb, pork, or poultry.  °Dairy °Plain yogurt, kefir, and unsweetened liquid yogurt. Lactose-free milk, buttermilk, or soy milk. Plain hard cheese. °Beverages °Sport drinks. Clear broths. Diluted fruit juices (except prune). Regular, caffeine-free sodas such as ginger ale. Water. Decaffeinated teas. Oral rehydration solutions. Sugar-free beverages not sweetened with sugar alcohols. °Other °Bouillon, broth, or soups made from recommended foods.  °The items listed above may not be a complete list of recommended foods or beverages. Contact your dietitian for more options. °WHAT FOODS ARE NOT RECOMMENDED? °Grains °Whole grain, whole wheat, bran, or rye breads, rolls, pastas, crackers, and cereals. Wild or brown rice. Cereals that contain more than 2 g of fiber per serving. Corn tortillas or taco shells. Cooked or dry oatmeal. Granola. Popcorn. °Vegetables °Raw vegetables. Cabbage, broccoli, Brussels sprouts, artichokes, baked beans, beet greens, corn, kale, legumes, peas, sweet potatoes, and yams. Potato skins. Cooked spinach and cabbage. °Fruits °Dried fruit, including raisins and dates. Raw fruits. Stewed or dried prunes. Fresh apples with skin, apricots, mangoes, pears, raspberries, and strawberries.  °Meat and Other Protein Products °Chunky peanut butter. Nuts and seeds. Beans and lentils. Bacon.  °Dairy °High-fat cheeses. Milk, chocolate milk, and beverages made with milk, such as milk shakes. Cream. Ice cream. °Sweets and Desserts °Sweet rolls, doughnuts, and sweet breads.   Pancakes and waffles. Fats and  Oils Butter. Cream sauces. Margarine. Salad oils. Plain salad dressings. Olives. Avocados.  Beverages Caffeinated beverages (such as coffee, tea, soda, or energy drinks). Alcoholic beverages. Fruit juices with pulp. Prune juice. Soft drinks sweetened with high-fructose corn syrup or sugar alcohols. Other Coconut. Hot sauce. Chili powder. Mayonnaise. Gravy. Cream-based or milk-based soups.  The items listed above may not be a complete list of foods and beverages to avoid. Contact your dietitian for more information. WHAT SHOULD I DO IF I BECOME DEHYDRATED? Diarrhea can sometimes lead to dehydration. Signs of dehydration include dark urine and dry mouth and skin. If you think you are dehydrated, you should rehydrate with an oral rehydration solution. These solutions can be purchased at pharmacies, retail stores, or online.  Drink -1 cup (120-240 mL) of oral rehydration solution each time you have an episode of diarrhea. If drinking this amount makes your diarrhea worse, try drinking smaller amounts more often. For example, drink 1-3 tsp (5-15 mL) every 5-10 minutes.  A general rule for staying hydrated is to drink 1-2 L of fluid per day. Talk to your health care provider about the specific amount you should be drinking each day. Drink enough fluids to keep your urine clear or pale yellow.   This information is not intended to replace advice given to you by your health care provider. Make sure you discuss any questions you have with your health care provider.   Document Released: 09/14/2003 Document Revised: 07/15/2014 Document Reviewed: 05/17/2013 Elsevier Interactive Patient Education 2016 Elsevier Inc.   Gastrointestinal Bleeding Gastrointestinal bleeding is bleeding somewhere along the path that food travels through the body (digestive tract). This path is anywhere between the mouth and the opening of the butt (anus). You may have blood in your throw up (vomit) or in your poop (stools). If  there is a lot of bleeding, you may need to stay in the hospital. HOME CARE  Only take medicine as told by your doctor.  Eat foods with fiber such as whole grains, fruits, and vegetables. You can also try eating 1 to 3 prunes a day.  Drink enough fluids to keep your pee (urine) clear or pale yellow. GET HELP RIGHT AWAY IF:   Your bleeding gets worse.  You feel dizzy, weak, or you pass out (faint).  You have bad cramps in your back or belly (abdomen).  You have large blood clumps (clots) in your poop.  Your problems are getting worse. MAKE SURE YOU:   Understand these instructions.  Will watch your condition.  Will get help right away if you are not doing well or get worse.   This information is not intended to replace advice given to you by your health care provider. Make sure you discuss any questions you have with your health care provider.   Document Released: 04/02/2008 Document Revised: 06/10/2012 Document Reviewed: 12/12/2014 Elsevier Interactive Patient Education [employer]2016 Elsevier Inc.

## 2016-04-14 ENCOUNTER — Emergency Department (HOSPITAL_COMMUNITY)
Admission: EM | Admit: 2016-04-14 | Discharge: 2016-04-15 | Disposition: A | Discharge status: Home / Self Care | Payer: Self-pay | Attending: Emergency Medicine | Admitting: Emergency Medicine

## 2016-04-14 ENCOUNTER — Encounter (HOSPITAL_COMMUNITY): Payer: Self-pay | Admitting: Emergency Medicine

## 2016-04-14 DIAGNOSIS — R6889 Other general symptoms and signs: Secondary | ICD-10-CM

## 2016-04-14 DIAGNOSIS — J111 Influenza due to unidentified influenza virus with other respiratory manifestations: Secondary | ICD-10-CM | POA: Insufficient documentation

## 2016-04-14 DIAGNOSIS — Z87891 Personal history of nicotine dependence: Secondary | ICD-10-CM | POA: Insufficient documentation

## 2016-04-14 LAB — RAPID STREP SCREEN (MED CTR MEBANE ONLY): STREPTOCOCCUS, GROUP A SCREEN (DIRECT): NEGATIVE

## 2016-04-14 LAB — URINALYSIS, ROUTINE W REFLEX MICROSCOPIC
Bilirubin Urine: NEGATIVE
GLUCOSE, UA: NEGATIVE mg/dL
KETONES UR: NEGATIVE mg/dL
Leukocytes, UA: NEGATIVE
Nitrite: NEGATIVE
PROTEIN: NEGATIVE mg/dL
Specific Gravity, Urine: 1.02 (ref 1.005–1.030)
pH: 6 (ref 5.0–8.0)

## 2016-04-14 LAB — URINE MICROSCOPIC-ADD ON

## 2016-04-14 LAB — POC URINE PREG, ED: Preg Test, Ur: NEGATIVE

## 2016-04-14 MED ORDER — ONDANSETRON 4 MG PO TBDP
4.0000 mg | ORAL_TABLET | Freq: Once | ORAL | Status: AC
  Administered 2016-04-14: 4 mg via ORAL
  Filled 2016-04-14: qty 1

## 2016-04-14 MED ORDER — TRAMADOL HCL 50 MG PO TABS
50.0000 mg | ORAL_TABLET | Freq: Once | ORAL | Status: AC
  Administered 2016-04-14: 50 mg via ORAL
  Filled 2016-04-14: qty 1

## 2016-04-14 NOTE — ED Provider Notes (Signed)
[facility]-EMERGENCY DEPT Provider Note   CSN: [id number] Arrival date & time: 04/14/16  2026     History   Chief Complaint Chief Complaint  Patient presents with  . Generalized Body Aches    HPI Sarah Norris is a 20 y.o. female.  HPI   Patient with PMH of TB and strep throat comes to the ER with 1 day history of sore throat, congestion, body aches, low grade fever, chills. She reports waking up this morning feeling this way. She did not try anything at home, she did not eat or drink anything today because she didn't feel well.    She denies headache, CP, SOB, back pain, dysuria, N/V/D.  Past Medical History:  Diagnosis Date  . Strep throat   . TB (pulmonary tuberculosis) 2015    There are no active problems to display for this patient.   Past Surgical History:  Procedure Laterality Date  . KNEE SURGERY      OB History    Gravida Para Term Preterm AB Living   1       1 0   SAB TAB Ectopic Multiple Live Births   1               Home Medications    Prior to Admission medications   Medication Sig Start Date End Date Taking? Authorizing Provider  acetaminophen (TYLENOL) 500 MG tablet Take 1 tablet (500 mg total) by mouth every 6 (six) hours as needed. Patient not taking: Reported on 04/14/2016 04/05/15   Cheri Fowler, PA-C  hydrOXYzine (ATARAX/VISTARIL) 10 MG tablet Take 1 tablet (10 mg total) by mouth every 6 (six) hours as needed for anxiety. Patient not taking: Reported on 04/14/2016 07/19/15   Everlene Farrier, PA-C  ondansetron (ZOFRAN ODT) 4 MG disintegrating tablet Take 1 tablet (4 mg total) by mouth every 8 (eight) hours as needed for nausea or vomiting. Patient not taking: Reported on 04/14/2016 02/16/16   Duane Lope, NP  oseltamivir (TAMIFLU) 75 MG capsule Take 1 capsule (75 mg total) by mouth 2 (two) times daily. 04/15/16   Gabriana Wilmott Neva Seat, PA-C  promethazine (PHENERGAN) 25 MG tablet Take 1 tablet (25 mg total) by mouth every 6 (six) hours as needed for nausea  or vomiting. Patient not taking: Reported on 04/14/2016 11/28/15   Judeth Horn, NP    Family History Family History  Problem Relation Age of Onset  . Hypertension Mother   . Diabetes Mother     Social History Social History  Substance Use Topics  . Smoking status: Former Games developer  . Smokeless tobacco: Never Used  . Alcohol use 3.6 oz/week    6 Shots of liquor per week     Comment: occ     Allergies   Review of patient's allergies indicates no known allergies.   Review of Systems Review of Systems  Review of Systems All other systems negative except as documented in the HPI. All pertinent positives and negatives as reviewed in the HPI.  Physical Exam Updated Vital Signs BP (!) 101/53   Pulse 98   Temp 99.6 F (37.6 C) (Oral)   Resp 18   Ht 5\' 3"  (1.6 m)   Wt 104.3 kg   LMP 03/20/2016 (Approximate)   SpO2 100%   BMI 40.74 kg/m   Physical Exam  Constitutional: She is oriented to person, place, and time. She appears well-developed and well-nourished. No distress.  HENT:  Head: Normocephalic and atraumatic.  Right Ear: Tympanic membrane, external ear and  ear canal normal.  Left Ear: Tympanic membrane, external ear and ear canal normal.  Nose: Nose normal. No rhinorrhea. Right sinus exhibits no maxillary sinus tenderness and no frontal sinus tenderness. Left sinus exhibits no maxillary sinus tenderness and no frontal sinus tenderness.  Mouth/Throat: Uvula is midline and mucous membranes are normal. No trismus in the jaw. Normal dentition. No dental abscesses or uvula swelling. Posterior oropharyngeal erythema present. No oropharyngeal exudate, posterior oropharyngeal edema or tonsillar abscesses.  No submental edema, tongue not elevated, no trismus. No impending airway obstruction; Pt able to speak full sentences, swallow intact, no drooling, stridor, or tonsillar/uvula displacement. No palatal petechia  Eyes: Conjunctivae are normal.  Neck: Trachea normal, normal range  of motion and full passive range of motion without pain. Neck supple. No neck rigidity. Normal range of motion present. No Brudzinski's sign noted.  Flexion and extension of neck without pain or difficulty. Able to breath without difficulty in extension.  Cardiovascular: Normal rate and regular rhythm.   Pulmonary/Chest: Effort normal and breath sounds normal. No stridor. No respiratory distress. She has no decreased breath sounds. She has no wheezes. She has no rhonchi. She has no rales.  Abdominal: Soft. There is no tenderness.  No obvious evidence of splenomegaly. Non ttp.   Musculoskeletal: Normal range of motion.  Lymphadenopathy:       Head (right side): No preauricular and no posterior auricular adenopathy present.       Head (left side): No preauricular and no posterior auricular adenopathy present.  Neurological: She is alert and oriented to person, place, and time.  Skin: Skin is warm and dry. No rash noted. She is not diaphoretic.  Psychiatric: She has a normal mood and affect.  Nursing note and vitals reviewed.    ED Treatments / Results  Labs (all labs ordered are listed, but only abnormal results are displayed) Labs Reviewed  URINALYSIS, ROUTINE W REFLEX MICROSCOPIC (NOT AT [facility]) - Abnormal; Notable for the following:       Result Value   APPearance CLOUDY (*)    Hgb urine dipstick SMALL (*)    All other components within normal limits  URINE MICROSCOPIC-ADD ON - Abnormal; Notable for the following:    Squamous Epithelial / LPF 0-5 (*)    Bacteria, UA RARE (*)    All other components within normal limits  CBC WITH DIFFERENTIAL/PLATELET - Abnormal; Notable for the following:    WBC 12.0 (*)    Hemoglobin 10.3 (*)    HCT 34.2 (*)    MCV 75.0 (*)    MCH 22.6 (*)    Neutro Abs 10.1 (*)    All other components within normal limits  BASIC METABOLIC PANEL - Abnormal; Notable for the following:    Sodium 133 (*)    Chloride 99 (*)    All other components within normal  limits  RAPID STREP SCREEN (NOT AT [facility])  CULTURE, GROUP A STREP ([facility])  MONONUCLEOSIS SCREEN  POC URINE PREG, ED    EKG  EKG Interpretation None       Radiology Dg Chest 2 View  Result Date: 04/15/2016 CLINICAL DATA:  Acute onset of body aches, fever, cough and nasal congestion. Initial encounter. EXAM: CHEST  2 VIEW COMPARISON:  Chest radiograph performed 03/24/2014 FINDINGS: The lungs are well-aerated and clear. There is no evidence of focal opacification, pleural effusion or pneumothorax. The heart is normal in size; the mediastinal contour is within normal limits. No acute osseous abnormalities are seen. IMPRESSION: No  acute cardiopulmonary process seen. Electronically Signed   By: Roanna Raider M.D.   On: 04/15/2016 01:37    Procedures Procedures (including critical care time)  Medications Ordered in ED Medications  traMADol (ULTRAM) tablet 50 mg (50 mg Oral Given 04/14/16 2307)  ondansetron (ZOFRAN-ODT) disintegrating tablet 4 mg (4 mg Oral Given 04/14/16 2307)  sodium chloride 0.9 % bolus 1,000 mL (0 mLs Intravenous Stopped 04/15/16 0120)  acetaminophen (TYLENOL) tablet 1,000 mg (1,000 mg Oral Given 04/15/16 0032)  sodium chloride 0.9 % bolus 1,000 mL (0 mLs Intravenous Stopped 04/15/16 0332)  oseltamivir (TAMIFLU) capsule 75 mg (75 mg Oral Given 04/15/16 0352)     Initial Impression / Assessment and Plan / ED Course  I have reviewed the triage vital signs and the nursing notes.  Pertinent labs & imaging results that were available during my care of the patient were reviewed by me and considered in my medical decision making (see chart for details).  Clinical Course    Patients strep, mono, urinalysis and chest xray are unremarkable. She developed fever while she was here that resolved with 2 Liters of fluids and Tylenol. CBC and BMP are also unremarkable. NO significant findings to explain patients symptoms - concern for the flu Discussed +/- Tamiflu with the patient  and she has elected to take it, she fits within the window.  Discussed for her to have a very low threshold to return to the ED if she is not feeling well.  Final Clinical Impressions(s) / ED Diagnoses   Final diagnoses:  Flu-like symptoms    New Prescriptions New Prescriptions   OSELTAMIVIR (TAMIFLU) 75 MG CAPSULE    Take 1 capsule (75 mg total) by mouth 2 (two) times daily.     Marlon Pel, PA-C 04/15/16 6045    Cathren Laine, MD 04/22/16 [phone]

## 2016-04-14 NOTE — ED Triage Notes (Signed)
Pt c/o chills and generalized body aches. Pt c/o "feeling hot".

## 2016-04-15 ENCOUNTER — Emergency Department (HOSPITAL_COMMUNITY): Payer: Self-pay

## 2016-04-15 LAB — CBC WITH DIFFERENTIAL/PLATELET
BASOS PCT: 0 %
Basophils Absolute: 0 10*3/uL (ref 0.0–0.1)
Eosinophils Absolute: 0 10*3/uL (ref 0.0–0.7)
Eosinophils Relative: 0 %
HEMATOCRIT: 34.2 % — AB (ref 36.0–46.0)
HEMOGLOBIN: 10.3 g/dL — AB (ref 12.0–15.0)
LYMPHS ABS: 1 10*3/uL (ref 0.7–4.0)
Lymphocytes Relative: 9 %
MCH: 22.6 pg — AB (ref 26.0–34.0)
MCHC: 30.1 g/dL (ref 30.0–36.0)
MCV: 75 fL — ABNORMAL LOW (ref 78.0–100.0)
MONOS PCT: 7 %
Monocytes Absolute: 0.8 10*3/uL (ref 0.1–1.0)
NEUTROS ABS: 10.1 10*3/uL — AB (ref 1.7–7.7)
NEUTROS PCT: 84 %
Platelets: 208 10*3/uL (ref 150–400)
RBC: 4.56 MIL/uL (ref 3.87–5.11)
RDW: 14.2 % (ref 11.5–15.5)
WBC: 12 10*3/uL — ABNORMAL HIGH (ref 4.0–10.5)

## 2016-04-15 LAB — BASIC METABOLIC PANEL
ANION GAP: 10 (ref 5–15)
BUN: 9 mg/dL (ref 6–20)
CHLORIDE: 99 mmol/L — AB (ref 101–111)
CO2: 24 mmol/L (ref 22–32)
Calcium: 9 mg/dL (ref 8.9–10.3)
Creatinine, Ser: 0.72 mg/dL (ref 0.44–1.00)
GFR calc Af Amer: >60 mL/min (ref >60)
GFR calc non Af Amer: >60 mL/min (ref >60)
Glucose, Bld: 95 mg/dL (ref 65–99)
POTASSIUM: 3.5 mmol/L (ref 3.5–5.1)
SODIUM: 133 mmol/L — AB (ref 135–145)

## 2016-04-15 LAB — MONONUCLEOSIS SCREEN: Mono Screen: NEGATIVE

## 2016-04-15 MED ORDER — ACETAMINOPHEN 500 MG PO TABS
1000.0000 mg | ORAL_TABLET | Freq: Once | ORAL | Status: AC
  Administered 2016-04-15: 1000 mg via ORAL
  Filled 2016-04-15: qty 2

## 2016-04-15 MED ORDER — OSELTAMIVIR PHOSPHATE 75 MG PO CAPS: 75.0000 mg | ORAL_CAPSULE | Freq: Two times a day (BID) | ORAL | 0 refills | Status: AC

## 2016-04-15 MED ORDER — SODIUM CHLORIDE 0.9 % IV BOLUS (SEPSIS)
1000.0000 mL | Freq: Once | INTRAVENOUS | Status: AC
  Administered 2016-04-15: 1000 mL via INTRAVENOUS

## 2016-04-15 MED ORDER — OSELTAMIVIR PHOSPHATE 75 MG PO CAPS
75.0000 mg | ORAL_CAPSULE | Freq: Once | ORAL | Status: AC
  Administered 2016-04-15: 75 mg via ORAL
  Filled 2016-04-15: qty 1

## 2016-04-15 NOTE — ED Notes (Signed)
Pt understood dc material. NAD noted. Scripts and worknote given at dc 

## 2016-04-17 LAB — CULTURE, GROUP A STREP (THRC)

## 2017-07-10 ENCOUNTER — Other Ambulatory Visit: Payer: Self-pay

## 2017-07-10 ENCOUNTER — Emergency Department (HOSPITAL_COMMUNITY)
Admission: EM | Admit: 2017-07-10 | Discharge: 2017-07-10 | Discharge status: Against Medical Advice | Payer: Self-pay | Attending: Emergency Medicine | Admitting: Emergency Medicine

## 2017-07-10 ENCOUNTER — Encounter (HOSPITAL_COMMUNITY): Payer: Self-pay | Admitting: *Deleted

## 2017-07-10 DIAGNOSIS — Z5321 Procedure and treatment not carried out due to patient leaving prior to being seen by health care provider: Secondary | ICD-10-CM | POA: Insufficient documentation

## 2017-07-10 DIAGNOSIS — R0602 Shortness of breath: Secondary | ICD-10-CM | POA: Insufficient documentation

## 2017-07-10 NOTE — ED Triage Notes (Signed)
The pt has anxiety .  She has not taken her anxiety med  No distress

## 2017-07-10 NOTE — ED Triage Notes (Signed)
Pt asleep in the triage chair when I came in she breathes like she has a coild but denies it.  She reports that she woke up from sleep not able to breathe  lmp  5 days ago

## 2017-07-10 NOTE — ED Notes (Signed)
No answer for room x2 

## 2018-08-31 IMAGING — CR DG CHEST 2V
2 series · 2 of 2 positions shown · non-contrast
Comparison: Chest radiograph performed 03/24/2014

CLINICAL DATA: Acute onset of body aches, fever, cough and nasal
congestion. Initial encounter.

EXAM:
CHEST  2 VIEW

[chest pa]
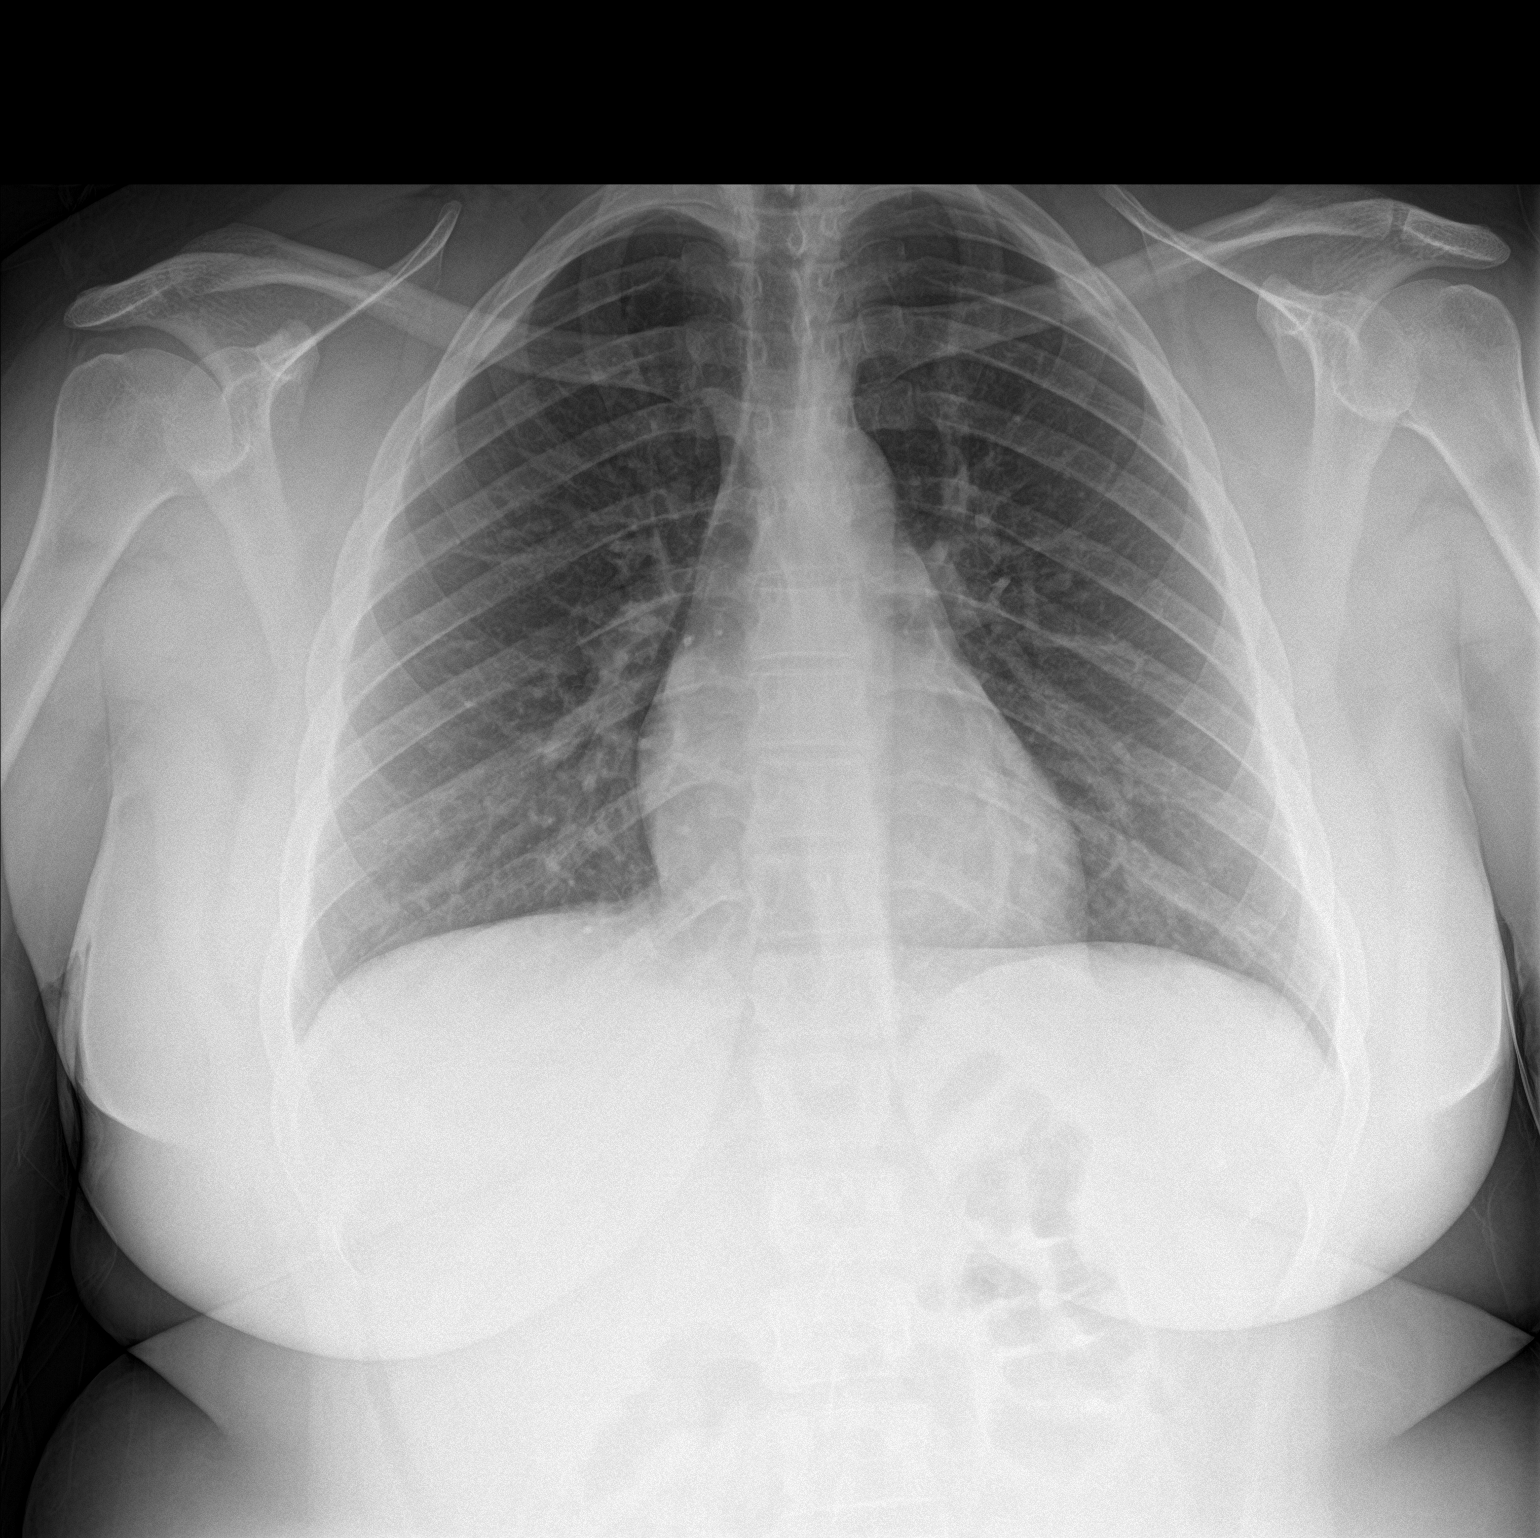

[chest lat]
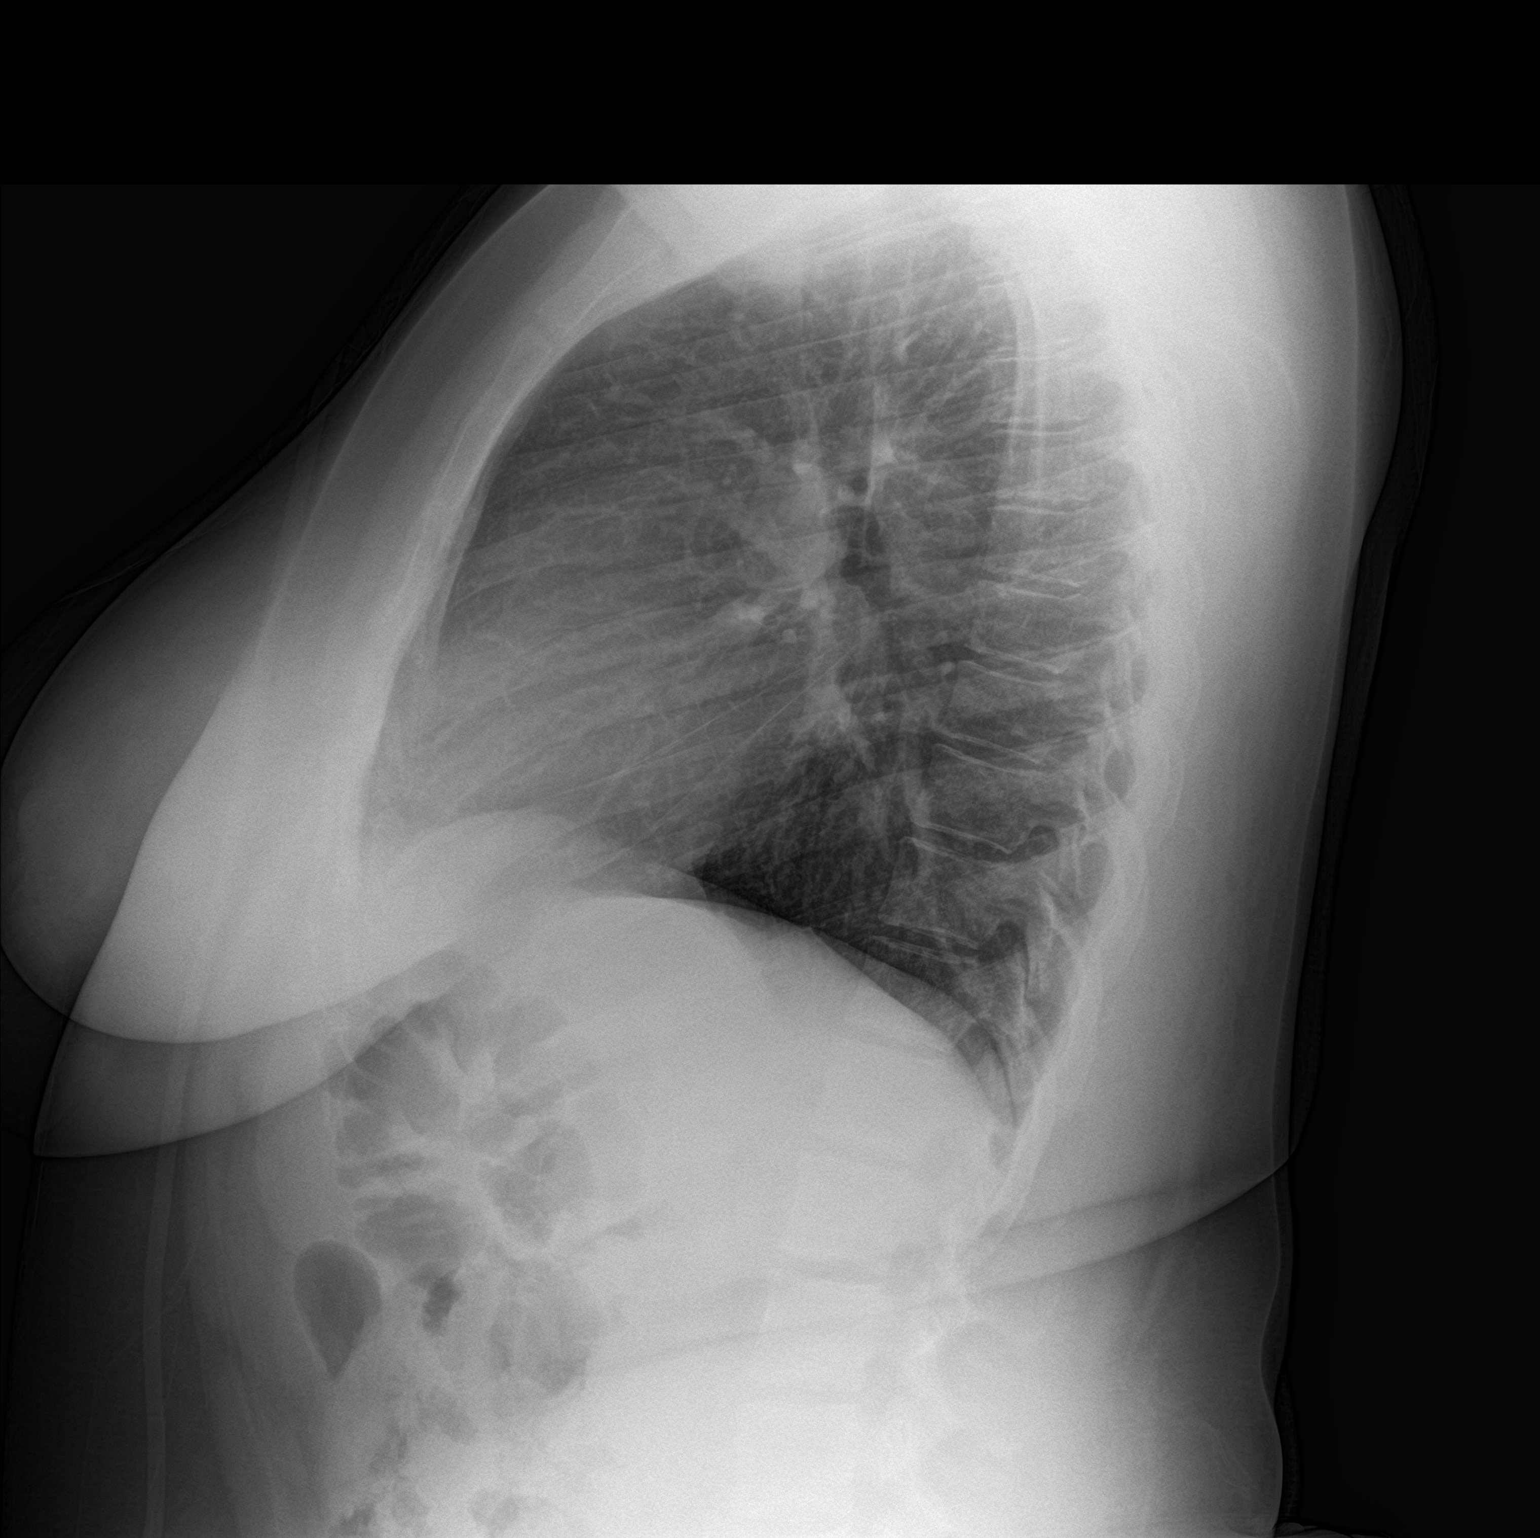

[2 of 2 positions shown; findings below may reference images not displayed]

FINDINGS: The lungs are well-aerated and clear. There is no evidence of focal
opacification, pleural effusion or pneumothorax.

The heart is normal in size; the mediastinal contour is within
normal limits. No acute osseous abnormalities are seen.
IMPRESSION: No acute cardiopulmonary process seen.

## 2022-01-09 ENCOUNTER — Encounter (HOSPITAL_COMMUNITY): Payer: Self-pay | Admitting: Emergency Medicine

## 2022-01-09 ENCOUNTER — Emergency Department (HOSPITAL_COMMUNITY)
Admission: EM | Admit: 2022-01-09 | Discharge: 2022-01-09 | Discharge status: Against Medical Advice | Payer: Self-pay | Attending: Emergency Medicine | Admitting: Emergency Medicine

## 2022-01-09 DIAGNOSIS — K0889 Other specified disorders of teeth and supporting structures: Secondary | ICD-10-CM | POA: Insufficient documentation

## 2022-01-09 DIAGNOSIS — Z5321 Procedure and treatment not carried out due to patient leaving prior to being seen by health care provider: Secondary | ICD-10-CM | POA: Insufficient documentation

## 2022-01-09 NOTE — ED Triage Notes (Addendum)
Patient complains of bottom right dental pain that started yesterday. Patient states she has a wisdom tooth erupting sideways and that is causing the pain. Patient states she has not been to a dentist due to lack of insurance. Patient is alert, oriented, ambulatory, and in no apparent distress at this time.

## 2022-01-09 NOTE — ED Provider Triage Note (Signed)
Emergency Medicine Provider Triage Evaluation Note  Sarah Norris , a 26 y.o. female  was evaluated in triage.  Pt complains of right-sided lower dental pain that began a few days ago.  Feels that her wisdom tooth is growing in sideways and she has a cavity which is causing her pain.  Minimal improvement noted with 200 mg of ibuprofen taken prior to arrival and Tylenol taken yesterday.  Has not seen dentist in several years.  No trismus or drooling, neck pain or trouble breathing.  Review of Systems  Positive: Dental pain Negative: Above  Physical Exam  BP 123/85   Pulse 79   Temp 99 F (37.2 C) (Oral)   Resp 16   SpO2 99%  Gen:   Awake, no distress   Resp:  Normal effort  MSK:   Moves extremities without difficulty  Other:  Right lower wisdom tooth emerging without trismus, drooling or respiratory distress  Medical Decision Making  Medically screening exam initiated at 2:22 PM.  Appropriate orders placed.  Sarah Norris was informed that the remainder of the evaluation will be completed by another provider, this initial triage assessment does not replace that evaluation, and the importance of remaining in the ED until their evaluation is complete.     Dietrich Pates, PA-C 01/09/22 1423

## 2022-01-09 NOTE — ED Notes (Signed)
Called no answer X2 

## 2022-01-09 NOTE — ED Notes (Signed)
Checked all RR and outside I do not see pt in lobby and cheeked on sleeping pts

## 2022-01-09 NOTE — ED Notes (Signed)
Called no answer

## 2022-11-25 ENCOUNTER — Other Ambulatory Visit: Payer: Self-pay | Admitting: Internal Medicine

## 2022-11-26 LAB — CBC
Hemoglobin: 10.2 g/dL — ABNORMAL LOW (ref 11.7–15.5)
MCH: 21.7 pg — ABNORMAL LOW (ref 27.0–33.0)
MCHC: 30.4 g/dL — ABNORMAL LOW (ref 32.0–36.0)
MCV: 71.5 fL — ABNORMAL LOW (ref 80.0–100.0)
RDW: 15 % (ref 11.0–15.0)
WBC: 5.3 10*3/uL (ref 3.8–10.8)

## 2022-11-26 LAB — COMPLETE METABOLIC PANEL WITH GFR
AST: 9 U/L — ABNORMAL LOW (ref 10–30)
Albumin: 3.9 g/dL (ref 3.6–5.1)
Alkaline phosphatase (APISO): 67 U/L (ref 31–125)
Calcium: 9.1 mg/dL (ref 8.6–10.2)
Chloride: 104 mmol/L (ref 98–110)
Creat: 0.53 mg/dL (ref 0.50–0.96)
Potassium: 4.4 mmol/L (ref 3.5–5.3)
Sodium: 138 mmol/L (ref 135–146)
Total Bilirubin: 0.3 mg/dL (ref 0.2–1.2)
Total Protein: 7.2 g/dL (ref 6.1–8.1)

## 2022-11-26 LAB — LIPID PANEL
Cholesterol: 176 mg/dL (ref <200)
HDL: 43 mg/dL — ABNORMAL LOW (ref >=50)
Non-HDL Cholesterol (Calc): 133 mg/dL (calc) — ABNORMAL HIGH (ref <130)
Triglycerides: 109 mg/dL (ref <150)

## 2022-11-26 LAB — VITAMIN B12: Vitamin B-12: 328 pg/mL (ref 200–1100)

## 2022-11-26 LAB — VITAMIN D 25 HYDROXY (VIT D DEFICIENCY, FRACTURES): Vit D, 25-Hydroxy: 7 ng/mL — ABNORMAL LOW (ref 30–100)

## 2022-11-26 LAB — FOLATE: Folate: 14.3 ng/mL

## 2022-11-26 LAB — TSH: TSH: 1.35 mIU/L

## 2022-11-30 LAB — COMPLETE METABOLIC PANEL WITH GFR
AG Ratio: 1.2 (calc) (ref 1.0–2.5)
ALT: 3 U/L — ABNORMAL LOW (ref 6–29)
BUN: 11 mg/dL (ref 7–25)
CO2: 24 mmol/L (ref 20–32)
Globulin: 3.3 g/dL (calc) (ref 1.9–3.7)
Glucose, Bld: 90 mg/dL (ref 65–99)
eGFR: 128 mL/min/{1.73_m2} (ref >=60)

## 2022-11-30 LAB — IRON, TOTAL/TOTAL IRON BINDING CAP
%SAT: 8 % (calc) — ABNORMAL LOW (ref 16–45)
Iron: 38 ug/dL — ABNORMAL LOW (ref 40–190)
TIBC: 482 mcg/dL (calc) — ABNORMAL HIGH (ref 250–450)

## 2022-11-30 LAB — CBC
HCT: 33.6 % — ABNORMAL LOW (ref 35.0–45.0)
MPV: 11 fL (ref 7.5–12.5)
Platelets: 272 10*3/uL (ref 140–400)
RBC: 4.7 10*6/uL (ref 3.80–5.10)

## 2022-11-30 LAB — CLIENT EDUCATION TRACKING

## 2022-11-30 LAB — LIPID PANEL
LDL Cholesterol (Calc): 111 mg/dL (calc) — ABNORMAL HIGH
Total CHOL/HDL Ratio: 4.1 (calc) (ref <5.0)

## 2022-11-30 LAB — B12 AND FOLATE PANEL
Folate: 14.3 ng/mL
Vitamin B-12: 328 pg/mL (ref 200–1100)

## 2022-11-30 LAB — FERRITIN: Ferritin: 7 ng/mL — ABNORMAL LOW (ref 16–154)

## 2023-12-24 ENCOUNTER — Emergency Department (HOSPITAL_COMMUNITY)
Admission: EM | Admit: 2023-12-24 | Discharge: 2023-12-25 | Discharge status: Against Medical Advice | Attending: Emergency Medicine | Admitting: Emergency Medicine

## 2023-12-24 ENCOUNTER — Other Ambulatory Visit: Payer: Self-pay

## 2023-12-24 ENCOUNTER — Encounter (HOSPITAL_COMMUNITY): Payer: Self-pay | Admitting: Emergency Medicine

## 2023-12-24 DIAGNOSIS — R109 Unspecified abdominal pain: Secondary | ICD-10-CM | POA: Insufficient documentation

## 2023-12-24 DIAGNOSIS — Z5321 Procedure and treatment not carried out due to patient leaving prior to being seen by health care provider: Secondary | ICD-10-CM | POA: Diagnosis not present

## 2023-12-24 LAB — COMPREHENSIVE METABOLIC PANEL WITH GFR
ALT: 8 U/L (ref 0–44)
AST: 12 U/L — ABNORMAL LOW (ref 15–41)
Albumin: 3.4 g/dL — ABNORMAL LOW (ref 3.5–5.0)
Alkaline Phosphatase: 55 U/L (ref 38–126)
Anion gap: 8 (ref 5–15)
BUN: 12 mg/dL (ref 6–20)
CO2: 25 mmol/L (ref 22–32)
Calcium: 9.4 mg/dL (ref 8.9–10.3)
Chloride: 103 mmol/L (ref 98–111)
Creatinine, Ser: 0.72 mg/dL (ref 0.44–1.00)
GFR, Estimated: >60 mL/min (ref >60)
Glucose, Bld: 103 mg/dL — ABNORMAL HIGH (ref 70–99)
Potassium: 4.2 mmol/L (ref 3.5–5.1)
Sodium: 136 mmol/L (ref 135–145)
Total Bilirubin: 0.6 mg/dL (ref 0.0–1.2)
Total Protein: 7.6 g/dL (ref 6.5–8.1)

## 2023-12-24 LAB — CBC WITH DIFFERENTIAL/PLATELET
Abs Immature Granulocytes: 0.01 10*3/uL (ref 0.00–0.07)
Basophils Absolute: 0 10*3/uL (ref 0.0–0.1)
Basophils Relative: 1 %
Eosinophils Absolute: 0.1 10*3/uL (ref 0.0–0.5)
Eosinophils Relative: 2 %
HCT: 34.7 % — ABNORMAL LOW (ref 36.0–46.0)
Hemoglobin: 10.4 g/dL — ABNORMAL LOW (ref 12.0–15.0)
Immature Granulocytes: 0 %
Lymphocytes Relative: 32 %
Lymphs Abs: 2 10*3/uL (ref 0.7–4.0)
MCH: 22 pg — ABNORMAL LOW (ref 26.0–34.0)
MCHC: 30 g/dL (ref 30.0–36.0)
MCV: 73.5 fL — ABNORMAL LOW (ref 80.0–100.0)
Monocytes Absolute: 0.5 10*3/uL (ref 0.1–1.0)
Monocytes Relative: 8 %
Neutro Abs: 3.5 10*3/uL (ref 1.7–7.7)
Neutrophils Relative %: 57 %
Platelets: 246 10*3/uL (ref 150–400)
RBC: 4.72 MIL/uL (ref 3.87–5.11)
RDW: 15.3 % (ref 11.5–15.5)
WBC: 6.2 10*3/uL (ref 4.0–10.5)
nRBC: 0 % (ref 0.0–0.2)

## 2023-12-24 LAB — URINALYSIS, ROUTINE W REFLEX MICROSCOPIC
Bilirubin Urine: NEGATIVE
Glucose, UA: NEGATIVE mg/dL
Hgb urine dipstick: NEGATIVE
Ketones, ur: NEGATIVE mg/dL
Leukocytes,Ua: NEGATIVE
Nitrite: NEGATIVE
Protein, ur: NEGATIVE mg/dL
Specific Gravity, Urine: 1.03 (ref 1.005–1.030)
pH: 5 (ref 5.0–8.0)

## 2023-12-24 LAB — HCG, SERUM, QUALITATIVE: Preg, Serum: NEGATIVE

## 2023-12-24 NOTE — ED Triage Notes (Signed)
 Patient reports abdominal cramping x 3 weeks on and off, worse today.  Patient also reports she's 3 weeks late for her period.  Patient denies any vaginal bleeding/discharge.  Patient gives verbal consent for MSE.

## 2023-12-25 NOTE — ED Notes (Signed)
 Pt was called by earlier to go back to a room, I just called, no answer
